# Patient Record
Sex: Male | Born: 1960 | Race: Black or African American | Hispanic: No | Marital: Married | Smoking: Never smoker
Health system: Southern US, Community
[De-identification: ages and names within clinical notes are randomized; demographics above are authoritative.]

## PROBLEM LIST (undated history)

## (undated) DIAGNOSIS — I10 Essential (primary) hypertension: Secondary | ICD-10-CM

## (undated) DIAGNOSIS — T7840XA Allergy, unspecified, initial encounter: Secondary | ICD-10-CM

## (undated) HISTORY — DX: Allergy, unspecified, initial encounter: T78.40XA

## (undated) HISTORY — PX: DG GREAT TOE RIGHT FOOT: HXRAD1657

## (undated) HISTORY — DX: Essential (primary) hypertension: I10

---

## 2009-06-27 HISTORY — PX: KNEE ARTHROSCOPY WITH MENISCAL REPAIR: SHX5653

## 2012-07-23 ENCOUNTER — Ambulatory Visit (INDEPENDENT_AMBULATORY_CARE_PROVIDER_SITE_OTHER): Payer: BC Managed Care – PPO | Admitting: Family Medicine

## 2012-07-23 VITALS — BP 130/84 | HR 56 | Temp 98.1°F | Resp 16 | Ht 68.0 in | Wt 177.0 lb

## 2012-07-23 DIAGNOSIS — Z0000 Encounter for general adult medical examination without abnormal findings: Secondary | ICD-10-CM

## 2012-07-23 LAB — LIPID PANEL
HDL: 55 mg/dL (ref >39)
LDL Cholesterol: 128 mg/dL — ABNORMAL HIGH (ref 0–99)
Total CHOL/HDL Ratio: 3.5 Ratio
Triglycerides: 53 mg/dL (ref <150)

## 2012-07-23 LAB — COMPREHENSIVE METABOLIC PANEL
AST: 14 U/L (ref 0–37)
Albumin: 4.4 g/dL (ref 3.5–5.2)
Alkaline Phosphatase: 45 U/L (ref 39–117)
BUN: 12 mg/dL (ref 6–23)
Creat: 0.97 mg/dL (ref 0.50–1.35)
Glucose, Bld: 103 mg/dL — ABNORMAL HIGH (ref 70–99)
Potassium: 4.1 mEq/L (ref 3.5–5.3)

## 2012-07-23 LAB — POCT CBC
HCT, POC: 46.9 % (ref 43.5–53.7)
Lymph, poc: 2.4 (ref 0.6–3.4)
MCH, POC: 29.6 pg (ref 27–31.2)
MCHC: 32.2 g/dL (ref 31.8–35.4)
MCV: 91.9 fL (ref 80–97)
MID (cbc): 0.4 (ref 0–0.9)
POC LYMPH PERCENT: 40 %L (ref 10–50)
Platelet Count, POC: 301 10*3/uL (ref 142–424)
RDW, POC: 12.6 %
WBC: 6 10*3/uL (ref 4.6–10.2)

## 2012-07-23 LAB — PSA: PSA: 0.49 ng/mL (ref <=4.00)

## 2012-07-23 NOTE — Progress Notes (Addendum)
[facility] [address] [place] [address] [phone]  Date:  07/23/2012   Name:  Isaiah Fowler   DOB:  12/15/1960   MRN:  [id number]  PCP:  No primary provider on file.    Chief Complaint: Employment Physical   History of Present Illness:  Isaiah Fowler is a 52 y.o. 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  He is fasting today, and does not have a PCP.  He is in the [employer] so he does get care at the [place]. He has had some labs done there but is not sure what he had exactly.  He would like to have labs again today. He is UTD on his tdap and flu shots, and had a colonoscopy last year.   He is not aware of any significant medical problems except for allergies for which he sometimes uses benadryl  There is no problem list on file for this patient.   Past Medical History  Diagnosis Date  . Allergy     No past surgical history on file.  History  Substance Use Topics  . Smoking status: Never Smoker   . Smokeless tobacco: Not on file  . Alcohol Use: Not on file    No family history on file.  No Known Allergies  Medication list has been reviewed and updated.  Current Outpatient Prescriptions on File Prior to Visit  Medication Sig Dispense Refill  . diphenhydrAMINE (BENADRYL) 25 mg capsule Take 25 mg by mouth every 6 (six) hours as needed.        Review of Systems:  As per HPI- otherwise negative.   Physical Examination: Filed Vitals:   07/23/12 0816  BP: 130/84  Pulse: 56  Temp: 98.1 F (36.7 C)  Resp: 16   Filed Vitals:   07/23/12 0816  Height: 5\' 8"  (1.727 m)  Weight: 177 lb (80.287 kg)   Body mass index is 26.91 kg/(m^2). Ideal Body Weight: Weight in (lb) to have BMI = 25: 164.1   GEN: WDWN, NAD, Non-toxic, A & O x 3 HEENT: Atraumatic, Normocephalic. Neck supple. No masses, No LAD. Bilateral TM wnl, oropharynx normal.  PEERL,EOMI.   Ears and Nose: No  external deformity. CV: RRR, No M/G/R. No JVD. No thrill. No extra heart sounds. PULM: CTA B, no wheezes, crackles, rhonchi. No retractions. No resp. distress. No accessory muscle use. ABD: S, NT, ND, +BS. No rebound. No HSM. EXTR: No c/c/e NEURO Normal gait. Normal strength and DTR all extremities PSYCH: Normally interactive. Conversant. Not depressed or anxious appearing.  Calm demeanor.  GU: no inguinal hernia, normal rectal exam  Assessment and Plan: 1. Physical exam, annual  POCT CBC, Comprehensive metabolic panel, Lipid panel, PSA   2 year DOT card issued today.   Will plan further follow- up pending labs.  Results for orders placed in visit on 07/23/12  POCT CBC      Component Value Range   WBC 6.0  4.6 - 10.2 K/uL   Lymph, poc 2.4  0.6 - 3.4   POC LYMPH PERCENT 40.0  10 - 50 %L   MID (cbc) 0.4  0 - 0.9   POC MID % 6.2  0 - 12 %M   POC Granulocyte 3.2  2 - 6.9   Granulocyte percent 53.8  37 - 80 %G   RBC 5.10  4.69 - 6.13 M/uL   Hemoglobin  15.1  14.1 - 18.1 g/dL   HCT, POC 16.1  09.6 - 53.7 %   MCV 91.9  80 - 97 fL   MCH, POC 29.6  27 - 31.2 pg   MCHC 32.2  31.8 - 35.4 g/dL   RDW, POC 04.5     Platelet Count, POC 301  142 - 424 K/uL   MPV 8.1  0 - 99.8 fL     [name], MD  11/02/2012-  Did a new card for him today to expire one year after original exam.  This is because he is actually taking lisinopril/ HCTZ which I discovered at his office visit on 10/29/12.  Explained to him that with HTN he qualifies for a one year card only on that date.  He brought in the card issued on 07/23/12 today and I gave a new card which expired on 07/23/2013

## 2012-07-23 NOTE — Patient Instructions (Addendum)
I will be in touch with your labs.  Do not take your benadryl before or during driving as it can cause drowsiness.

## 2012-07-24 ENCOUNTER — Encounter: Payer: Self-pay | Admitting: Family Medicine

## 2012-07-26 ENCOUNTER — Encounter: Payer: Self-pay | Admitting: Family Medicine

## 2012-10-29 ENCOUNTER — Ambulatory Visit (INDEPENDENT_AMBULATORY_CARE_PROVIDER_SITE_OTHER): Payer: BC Managed Care – PPO | Admitting: Family Medicine

## 2012-10-29 VITALS — BP 144/84 | HR 53 | Temp 98.1°F | Resp 16 | Ht 67.0 in | Wt 177.0 lb

## 2012-10-29 DIAGNOSIS — Z2089 Contact with and (suspected) exposure to other communicable diseases: Secondary | ICD-10-CM

## 2012-10-29 DIAGNOSIS — I10 Essential (primary) hypertension: Secondary | ICD-10-CM | POA: Insufficient documentation

## 2012-10-29 DIAGNOSIS — Z202 Contact with and (suspected) exposure to infections with a predominantly sexual mode of transmission: Secondary | ICD-10-CM

## 2012-10-29 MED ORDER — LISINOPRIL-HYDROCHLOROTHIAZIDE 10-12.5 MG PO TABS: 1.0000 | ORAL_TABLET | Freq: Every day | ORAL | Status: DC

## 2012-10-29 NOTE — Patient Instructions (Addendum)
Continue to take your blood pressure medication daily- it will work much better if taken every day.    I must get your old DOT card back by the end of the week- we will exchange it for a one year card because you are being treated for high blood pressure.  If we do not receive your old DOT card we will have to contact the DOT.

## 2012-10-29 NOTE — Progress Notes (Addendum)
[facility] [address] [place] [address] [phone]  Date:  10/29/2012   Name:  Isaiah Fowler   DOB:  04-Aug-1960   MRN:  [id number]  PCP:  No primary provider on file.    Chief Complaint: Medication Refill   History of Present Illness:  Isaiah Fowler is a 52 y.o. very pleasant male patient who presents with the following:  He was here in January of 2014 and had a DOT exam. Of note he did not claim to be on any BP medication at that time. I now noted that his medication list includes lisinopril/ HCTZ. It seems he ?did not understand that this medication was for HTN.  He thought it was "for fluid."    He notes a "knot" in his lower lip for about 1 week.  It does not hurt.  It seems smaller now but he wants me to check this for him.    He is also concerned because a male partner recently contacted him because she has been dx with an STI- he does not know what.  He was last with her about one month ago.  He also used condoms so he is not too worried, but would like to have an STI screen.  He does not have any symptoms such as discharge.   He also needs a RF of his BP medication- HCTZ/ lisinopril. He has been taking this for about 1 year.  He admits that he forgets to take it about 1/2 of the time.    There are no active problems to display for this patient.   Past Medical History  Diagnosis Date  . Allergy     No past surgical history on file.  History  Substance Use Topics  . Smoking status: Never Smoker   . Smokeless tobacco: Not on file  . Alcohol Use: Not on file    No family history on file.  No Known Allergies  Medication list has been reviewed and updated.  Current Outpatient Prescriptions on File Prior to Visit  Medication Sig Dispense Refill  . diphenhydrAMINE (BENADRYL) 25 mg capsule Take 25 mg by mouth every 6 (six) hours as needed.       No current facility-administered medications on file prior to visit.    Review of  Systems:  As per HPI- otherwise negative.   Physical Examination: Filed Vitals:   10/29/12 1041  BP: 144/84  Pulse: 53  Temp: 98.1 F (36.7 C)  Resp: 16   Filed Vitals:   10/29/12 1041  Height: 5\' 7"  (1.702 m)  Weight: 177 lb (80.287 kg)   Body mass index is 27.72 kg/(m^2). Ideal Body Weight: Weight in (lb) to have BMI = 25: 159.3  GEN: WDWN, NAD, Non-toxic, A & O x 3 HEENT: Atraumatic, Normocephalic. Neck supple. No masses, No LAD.  Lower lip- there is a round, firm, mobile mass under the right side of his lower lip. This seems consistent with a cyst, the size of a small pea Ears and Nose: No external deformity. CV: RRR, No M/G/R. No JVD. No thrill. No extra heart sounds. PULM: CTA B, no wheezes, crackles, rhonchi. No retractions. No resp. distress. No accessory muscle use. ABD: S, NT, ND, +BS. No rebound. No HSM. EXTR: No c/c/e NEURO Normal gait.  PSYCH: Normally interactive. Conversant. Not depressed or anxious appearing.  Calm demeanor.  GU: normal, no redness, lesions or discharge   Assessment and Plan: HTN (hypertension) - Plan: lisinopril-hydrochlorothiazide (PRINZIDE,ZESTORETIC)  10-12.5 MG per tablet  Exposure to STD - Plan: Hepatitis B surface antigen, HIV antibody, HSV(herpes simplex vrs) 1+2 ab-IgG, RPR, Hepatitis C antibody, GC/Chlamydia Probe Amp  Refilled his HTN medication.  I did labs for him in January of this year.  Explained that being on HTN medications changes his DOT status- he can only be certified for one year.  He will bring in his old card and will exchange for a new one- he does not have his card with him today.  BP control is ok, but advised him to take the medication every day  Advised him to have his lip cyst looked at by his dentist who may want to refer him to an oral surgeon for removal  Await STI results.    Signed Abbe Amsterdam, MD  10/31/12; called to discuss labs.  LMOM as no answer.  Labs ok, will mail copy. He does need to come in  and bring me his old DOT card so I can change the expiration date.  Asked for this by the end of this week please   Results for orders placed in visit on 10/29/12  GC/CHLAMYDIA PROBE AMP      Result Value Range   CT Probe RNA NEGATIVE     GC Probe RNA NEGATIVE    HEPATITIS B SURFACE ANTIGEN      Result Value Range   Hepatitis B Surface Ag NEGATIVE  NEGATIVE  HIV ANTIBODY (ROUTINE TESTING)      Result Value Range   HIV NON REACTIVE  NON REACTIVE  HSV(HERPES SIMPLEX VRS) I + II AB-IGG      Result Value Range   HSV 1 Glycoprotein G Ab, IgG 11.32 (*)    HSV 2 Glycoprotein G Ab, IgG <0.10    RPR      Result Value Range   RPR NON REAC  NON REAC  HEPATITIS C ANTIBODY      Result Value Range   HCV Ab NEGATIVE  NEGATIVE

## 2012-10-30 LAB — HSV(HERPES SIMPLEX VRS) I + II AB-IGG
HSV 1 Glycoprotein G Ab, IgG: 11.32 IV — ABNORMAL HIGH
HSV 2 Glycoprotein G Ab, IgG: <0.1 IV

## 2012-10-30 LAB — GC/CHLAMYDIA PROBE AMP: GC Probe RNA: NEGATIVE

## 2012-10-31 ENCOUNTER — Encounter: Payer: Self-pay | Admitting: Family Medicine

## 2012-11-02 ENCOUNTER — Telehealth: Payer: Self-pay | Admitting: Family Medicine

## 2012-11-02 NOTE — Telephone Encounter (Signed)
Called Laurelyn Sickle- asked her to please give Mr. Pyper a call and remind him to come in to exchange his DOT card.  She will do so.

## 2014-04-11 ENCOUNTER — Ambulatory Visit (INDEPENDENT_AMBULATORY_CARE_PROVIDER_SITE_OTHER): Payer: BC Managed Care – PPO | Admitting: Family Medicine

## 2014-04-11 ENCOUNTER — Encounter: Payer: Self-pay | Admitting: Family Medicine

## 2014-04-11 ENCOUNTER — Ambulatory Visit (INDEPENDENT_AMBULATORY_CARE_PROVIDER_SITE_OTHER): Payer: Self-pay | Admitting: Family Medicine

## 2014-04-11 ENCOUNTER — Encounter: Payer: BC Managed Care – PPO | Admitting: Family Medicine

## 2014-04-11 VITALS — BP 118/84 | HR 54 | Temp 98.1°F | Resp 16 | Ht 68.0 in | Wt 172.0 lb

## 2014-04-11 DIAGNOSIS — I10 Essential (primary) hypertension: Secondary | ICD-10-CM

## 2014-04-11 DIAGNOSIS — E785 Hyperlipidemia, unspecified: Secondary | ICD-10-CM

## 2014-04-11 DIAGNOSIS — R739 Hyperglycemia, unspecified: Secondary | ICD-10-CM

## 2014-04-11 DIAGNOSIS — Z024 Encounter for examination for driving license: Secondary | ICD-10-CM

## 2014-04-11 DIAGNOSIS — Z23 Encounter for immunization: Secondary | ICD-10-CM

## 2014-04-11 DIAGNOSIS — Z020 Encounter for examination for admission to educational institution: Secondary | ICD-10-CM

## 2014-04-11 LAB — COMPLETE METABOLIC PANEL WITH GFR
ALBUMIN: 4.4 g/dL (ref 3.5–5.2)
ALK PHOS: 43 U/L (ref 39–117)
ALT: 15 U/L (ref 0–53)
AST: 21 U/L (ref 0–37)
BILIRUBIN TOTAL: 1.4 mg/dL — AB (ref 0.2–1.2)
BUN: 12 mg/dL (ref 6–23)
CO2: 30 mEq/L (ref 19–32)
CREATININE: 0.96 mg/dL (ref 0.50–1.35)
Calcium: 9.7 mg/dL (ref 8.4–10.5)
Chloride: 100 mEq/L (ref 96–112)
GFR, EST NON AFRICAN AMERICAN: 89 mL/min
GFR, Est African American: >89 mL/min
GLUCOSE: 96 mg/dL (ref 70–99)
Potassium: 3.9 mEq/L (ref 3.5–5.3)
Sodium: 134 mEq/L — ABNORMAL LOW (ref 135–145)
Total Protein: 7.2 g/dL (ref 6.0–8.3)

## 2014-04-11 LAB — LIPID PANEL
CHOL/HDL RATIO: 3.4 ratio
CHOLESTEROL: 177 mg/dL (ref 0–200)
HDL: 52 mg/dL (ref >39)
LDL Cholesterol: 110 mg/dL — ABNORMAL HIGH (ref 0–99)
TRIGLYCERIDES: 73 mg/dL (ref <150)
VLDL: 15 mg/dL (ref 0–40)

## 2014-04-11 MED ORDER — LISINOPRIL-HYDROCHLOROTHIAZIDE 10-12.5 MG PO TABS: 1.0000 | ORAL_TABLET | Freq: Every day | ORAL | Status: DC

## 2014-04-11 NOTE — Patient Instructions (Signed)
1 year card. See paperwork.

## 2014-04-11 NOTE — Progress Notes (Signed)
Subjective:    Patient ID: Isaiah Fowler, male    DOB: 1961/04/15, 53 y.o.   MRN: [id number]  This chart was scribed for Meredith StaggersJeffrey Anabella Capshaw, MD by Tonye RoyaltyJoshua Chen, ED Scribe. This patient was seen in room 5 and the patient's care was started at 10:12 AM.   Chief Complaint  Patient presents with  . Employment Physical    dot   HPI  HPI Comments: Isaiah Fowler is a 53 y.o. male who [facility] for [facility] physical, see paperwork. He was last issued a 1 year card on 07/23/12 due to hypertension. He denies other medical problems including stroke or heart attack. He states he rarely snores at night, and he denies apnea or daytime somnolence. He denies vision problems except that he uses reading glasses; he denies glare problems at night. He reports normal use of extremities. He denies ever getting lightheaded or dizzy. He states he has 3 jobs and works out often.   Patient Active Problem List   Diagnosis Date Noted  . Essential hypertension, benign 10/29/2012   Past Medical History  Diagnosis Date  . Allergy    History reviewed. No pertinent past surgical history. No Known Allergies Prior to Admission medications   Medication Sig Start Date End Date Taking? Authorizing Provider  diphenhydrAMINE (BENADRYL) 25 mg capsule Take 25 mg by mouth every 6 (six) hours as needed.   Yes Historical Provider, MD  lisinopril-hydrochlorothiazide (PRINZIDE,ZESTORETIC) 10-12.5 MG per tablet Take 1 tablet by mouth daily. 10/29/12  Yes Pearline CablesJessica C Copland, MD   History   Social History  . Marital Status: Married    Spouse Name: N/A    Number of Children: N/A  . Years of Education: N/A   Occupational History  . Not on file.   Social History Main Topics  . Smoking status: Never Smoker   . Smokeless tobacco: Not on file  . Alcohol Use: Not on file  . Drug Use: Not on file  . Sexual Activity: Not on file   Other Topics Concern  . Not on file   Social History Narrative  . No  narrative on file    Review of Systems  Eyes: Negative for visual disturbance.  Respiratory: Negative for apnea.   Neurological: Negative for dizziness, weakness and light-headedness.   Per DOT paperwork, otheriwse negative as above.      Objective:   Physical Exam  Vitals reviewed. Constitutional: He is oriented to person, place, and time. He appears well-developed and well-nourished.  HENT:  Head: Normocephalic and atraumatic.  Right Ear: External ear normal.  Left Ear: External ear normal.  Mouth/Throat: Oropharynx is clear and moist.  Eyes: Conjunctivae and EOM are normal. Pupils are equal, round, and reactive to light.  Neck: Normal range of motion. Neck supple. No thyromegaly present.  Cardiovascular: Normal rate, regular rhythm, normal heart sounds and intact distal pulses.   Pulmonary/Chest: Effort normal and breath sounds normal. No respiratory distress. He has no wheezes.  Abdominal: Soft. He exhibits no distension. There is no tenderness. Hernia confirmed negative in the right inguinal area and confirmed negative in the left inguinal area.  Musculoskeletal: Normal range of motion. He exhibits no edema and no tenderness.  Lymphadenopathy:    He has no cervical adenopathy.  Neurological: He is alert and oriented to person, place, and time. He has normal reflexes.  Skin: Skin is warm and dry.  Psychiatric: He has a normal mood and affect. His behavior is  normal.   Filed Vitals:   04/11/14 0954  BP: 118/84  Pulse: 54  Temp: 98.1 F (36.7 C)  TempSrc: Oral  Resp: 16  Height: 5\' 8"  (1.727 m)  Weight: 172 lb (78.019 kg)  SpO2: 100%    Visual Acuity Screening   Right eye Left eye Both eyes  Without correction: 20/20 20/20 20/13   With correction:     Comments: 85 horizontal field both eye  6/6 color  Hearing Screening Comments: 10 feet right 10 feet Left       Assessment & Plan:  Isaiah Fowler is a 53 y.o. male Encounter for commercial driver medical  examination (CDME)  see ppwk - DOT 1 year card with HTN.   No orders of the defined types were placed in this encounter.   Patient Instructions  1 year card. See paperwork.

## 2014-04-11 NOTE — Progress Notes (Signed)
This encounter was created in error - please disregard.

## 2014-04-11 NOTE — Patient Instructions (Addendum)
I refilled your medication. You should receive a call or letter about your lab results within the next week to 10 days.  We will call you to schedule physical in next 6 months.     Influenza Vaccine (Flu Vaccine, Inactivated or Recombinant) 2014-2015: What You Need to Know 1. Why get vaccinated? Influenza ("flu") is a contagious disease that spreads around the [place] every winter, usually between October and May. Flu is caused by influenza viruses, and is spread mainly by coughing, sneezing, and close contact. Anyone can get flu, but the risk of getting flu is highest among children. Symptoms come on suddenly and may last several days. They can include:  fever/chills  sore throat  muscle aches  fatigue  cough  headache  runny or stuffy nose Flu can make some people much sicker than others. These people include young children, people 265 and older, pregnant women, and people with certain health conditions-such as heart, lung or kidney disease, nervous system disorders, or a weakened immune system. Flu vaccination is especially important for these people, and anyone in close contact with them. Flu can also lead to pneumonia, and make existing medical conditions worse. It can cause diarrhea and seizures in children. Each year thousands of people in the [place] die from flu, and many more are hospitalized. Flu vaccine is the best protection against flu and its complications. Flu vaccine also helps prevent spreading flu from person to person. 2. Inactivated and recombinant flu vaccines You are getting an injectable flu vaccine, which is either an "inactivated" or "recombinant" vaccine. These vaccines do not contain any live influenza virus. They are given by injection with a needle, and often called the "flu shot."  A different live, attenuated (weakened) influenza vaccine is sprayed into the nostrils. This vaccine is described in a separate Vaccine Information Statement. Flu  vaccination is recommended every year. Some children 6 months through 888 years of age might need two doses during one year. Flu viruses are always changing. Each year's flu vaccine is made to protect against 3 or 4 viruses that are likely to cause disease that year. Flu vaccine cannot prevent all cases of flu, but it is the best defense against the disease.  It takes about 2 weeks for protection to develop after the vaccination, and protection lasts several months to a year. Some illnesses that are not caused by influenza virus are often mistaken for flu. Flu vaccine will not prevent these illnesses. It can only prevent influenza. Some inactivated flu vaccine contains a very small amount of a mercury-based preservative called thimerosal. Studies have shown that thimerosal in vaccines is not harmful, but flu vaccines that do not contain a preservative are available. 3. Some people should not get this vaccine Tell the person who gives you the vaccine:  If you have any severe, life-threatening allergies. If you ever had a life-threatening allergic reaction after a dose of flu vaccine, or have a severe allergy to any part of this vaccine, including (for example) an allergy to gelatin, antibiotics, or eggs, you may be advised not to get vaccinated. Most, but not all, types of flu vaccine contain a small amount of egg protein.  If you ever had Guillain-Barr Syndrome (a severe paralyzing illness, also called GBS). Some people with a history of GBS should not get this vaccine. This should be discussed with your doctor.  If you are not feeling well. It is usually okay to get flu vaccine when you have a mild  illness, but you might be advised to wait until you feel better. You should come back when you are better. 4. Risks of a vaccine reaction With a vaccine, like any medicine, there is a chance of side effects. These are usually mild and go away on their own. Problems that could happen after any  vaccine:  Brief fainting spells can happen after any medical procedure, including vaccination. Sitting or lying down for about 15 minutes can help prevent fainting, and injuries caused by a fall. Tell your doctor if you feel dizzy, or have vision changes or ringing in the ears.  Severe shoulder pain and reduced range of motion in the arm where a shot was given can happen, very rarely, after a vaccination.  Severe allergic reactions from a vaccine are very rare, estimated at less than 1 in a million doses. If one were to occur, it would usually be within a few minutes to a few hours after the vaccination. Mild problems following inactivated flu vaccine:  soreness, redness, or swelling where the shot was given  hoarseness  sore, red or itchy eyes  cough  fever  aches  headache  itching  fatigue If these problems occur, they usually begin soon after the shot and last 1 or 2 days. Moderate problems following inactivated flu vaccine:  Young children who get inactivated flu vaccine and pneumococcal vaccine (PCV13) at the same time may be at increased risk for seizures caused by fever. Ask your doctor for more information. Tell your doctor if a child who is getting flu vaccine has ever had a seizure. Inactivated flu vaccine does not contain live flu virus, so you cannot get the flu from this vaccine. As with any medicine, there is a very remote chance of a vaccine causing a serious injury or death. The safety of vaccines is always being monitored. For more information, visit: http://floyd.org/www.cdc.gov/vaccinesafety/ 5. What if there is a serious reaction? What should I look for?  Look for anything that concerns you, such as signs of a severe allergic reaction, very high fever, or behavior changes. Signs of a severe allergic reaction can include hives, swelling of the face and throat, difficulty breathing, a fast heartbeat, dizziness, and weakness. These would start a few minutes to a few hours after  the vaccination. What should I do?  If you think it is a severe allergic reaction or other emergency that can't wait, call 9-1-1 and get the person to the nearest hospital. Otherwise, call your doctor.  Afterward, the reaction should be reported to the Vaccine Adverse Event Reporting System (VAERS). Your doctor should file this report, or you can do it yourself through the VAERS web site at www.vaers.LAgents.nohhs.gov, or by calling 1-445-141-8886. VAERS does not give medical advice. 6. The National Vaccine Injury Compensation Program The [employer] Vaccine Injury Compensation Program (VICP) is a federal program that was created to compensate people who may have been injured by certain vaccines. Persons who believe they may have been injured by a vaccine can learn about the program and about filing a claim by calling 1-229-544-4915 or visiting the VICP website at SpiritualWord.atwww.hrsa.gov/vaccinecompensation. There is a time limit to file a claim for compensation. 7. How can I learn more?  Ask your health care provider.  Call your local or state health department.  Contact the Centers for Disease Control and Prevention (CDC):  Call 61674378691-(724) 876-0393 (1-800-CDC-INFO) or  Visit CDC's website at BiotechRoom.com.cywww.cdc.gov/flu [facility] CenterCDC Vaccine Information Statement (Interim) Inactivated Influenza Vaccine (02/12/2013) Document Released: 04/07/2006 Document  Revised: 10/28/2013 Document Reviewed: 05/31/2013 [facility] Patient Information 2015 [place], [place]. This information is not intended to replace advice given to you by your health care provider. Make sure you discuss any questions you have with your health care provider.

## 2014-04-11 NOTE — Progress Notes (Signed)
Subjective:    Patient ID: Isaiah Fowler, male    DOB: Jul 30, 1960, 53 y.o.   MRN: [id number]  This chart was scribed for Meredith StaggersJeffrey Coleson Kant, MD by Tonye RoyaltyJoshua Chen, ED Scribe. This patient was seen in room 5 and the patient's care was started at 10:26 AM.   Chief Complaint  Patient presents with  . med refills    lisinopril  . Flu Vaccine   HPI  HPI Comments: Isaiah Fowler is a 53 y.o. male with history of hypertension who presents to the [facility] for medication refill. He states he typically comes here for care. He uses Lisinopril 10-12.5 mg BID. He reports taking Benadryl for allergies as needed and states he does not use it while driving. His last blood work was in January 2014 and revealed borderline glucose 103, borderline LDL 128, and normal kidney function with creatinine 0.97. He states he did not fast before that lab work.   Patient Active Problem List   Diagnosis Date Noted  . Essential hypertension, benign 10/29/2012   Past Medical History  Diagnosis Date  . Allergy    No past surgical history on file. No Known Allergies Prior to Admission medications   Medication Sig Start Date End Date Taking? Authorizing Provider  diphenhydrAMINE (BENADRYL) 25 mg capsule Take 25 mg by mouth every 6 (six) hours as needed.    Historical Provider, MD  lisinopril-hydrochlorothiazide (PRINZIDE,ZESTORETIC) 10-12.5 MG per tablet Take 1 tablet by mouth daily. 04/11/14   Shade FloodJeffrey R Inaya Gillham, MD   History   Social History  . Marital Status: Married    Spouse Name: N/A    Number of Children: N/A  . Years of Education: N/A   Occupational History  . Not on file.   Social History Main Topics  . Smoking status: Never Smoker   . Smokeless tobacco: Not on file  . Alcohol Use: Not on file  . Drug Use: Not on file  . Sexual Activity: Not on file   Other Topics Concern  . Not on file   Social History Narrative  . No narrative on file     Review of Systems  Constitutional:  Negative for fatigue and unexpected weight change.  Eyes: Negative for visual disturbance.  Respiratory: Negative for cough, chest tightness and shortness of breath.   Cardiovascular: Negative for chest pain, palpitations and leg swelling.  Gastrointestinal: Negative for abdominal pain and blood in stool.  Neurological: Negative for dizziness, light-headedness and headaches.       Objective:   Physical Exam  Vitals reviewed. Constitutional: He is oriented to person, place, and time. He appears well-developed and well-nourished.  HENT:  Head: Normocephalic and atraumatic.  Eyes: EOM are normal. Pupils are equal, round, and reactive to light.  Neck: No JVD present. Carotid bruit is not present.  Cardiovascular: Normal rate, regular rhythm and normal heart sounds.   No murmur heard. Pulmonary/Chest: Effort normal and breath sounds normal. He has no rales.  Musculoskeletal: He exhibits no edema.  Neurological: He is alert and oriented to person, place, and time.  Skin: Skin is warm and dry.  Psychiatric: He has a normal mood and affect.        Assessment & Plan:   Isaiah Fowler is a 53 y.o. male Essential hypertension - Plan: lisinopril-hydrochlorothiazide (PRINZIDE,ZESTORETIC) 10-12.5 MG per tablet, COMPLETE METABOLIC PANEL WITH GFR, Lipid panel  -controlled, meds refilled x 6 months.  Plan on cpe in next 6 months - will call to  schedule.   Hyperlipidemia - Plan: COMPLETE METABOLIC PANEL WITH GFR, Lipid panel  -borderline LDL when checked last.  fasting this am - repeated lipids and CMP.Marland Kitchen  Hyperglycemia - Plan: COMPLETE METABOLIC PANEL WITH GFR  -borderline on last labs - may not have been fasting but is today. Repeat CMP  Need for prophylactic vaccination and inoculation against influenza - Plan: Flu Vaccine QUAD 36+ mos IM given.   Meds ordered this encounter  Medications  . lisinopril-hydrochlorothiazide (PRINZIDE,ZESTORETIC) 10-12.5 MG per tablet    Sig: Take 1 tablet by  mouth daily.    Dispense:  90 tablet    Refill:  1   Patient Instructions  I refilled your medication. You should receive a call or letter about your lab results within the next week to 10 days.  We will call you to schedule physical in next 6 months.     Influenza Vaccine (Flu Vaccine, Inactivated or Recombinant) 2014-2015: What You Need to Know 1. Why get vaccinated? Influenza ("flu") is a contagious disease that spreads around the [place] every winter, usually between October and May. Flu is caused by influenza viruses, and is spread mainly by coughing, sneezing, and close contact. Anyone can get flu, but the risk of getting flu is highest among children. Symptoms come on suddenly and may last several days. They can include:  fever/chills  sore throat  muscle aches  fatigue  cough  headache  runny or stuffy nose Flu can make some people much sicker than others. These people include young children, people 89 and older, pregnant women, and people with certain health conditions-such as heart, lung or kidney disease, nervous system disorders, or a weakened immune system. Flu vaccination is especially important for these people, and anyone in close contact with them. Flu can also lead to pneumonia, and make existing medical conditions worse. It can cause diarrhea and seizures in children. Each year thousands of people in the [place] die from flu, and many more are hospitalized. Flu vaccine is the best protection against flu and its complications. Flu vaccine also helps prevent spreading flu from person to person. 2. Inactivated and recombinant flu vaccines You are getting an injectable flu vaccine, which is either an "inactivated" or "recombinant" vaccine. These vaccines do not contain any live influenza virus. They are given by injection with a needle, and often called the "flu shot."  A different live, attenuated (weakened) influenza vaccine is sprayed into the  nostrils. This vaccine is described in a separate Vaccine Information Statement. Flu vaccination is recommended every year. Some children 6 months through 35 years of age might need two doses during one year. Flu viruses are always changing. Each year's flu vaccine is made to protect against 3 or 4 viruses that are likely to cause disease that year. Flu vaccine cannot prevent all cases of flu, but it is the best defense against the disease.  It takes about 2 weeks for protection to develop after the vaccination, and protection lasts several months to a year. Some illnesses that are not caused by influenza virus are often mistaken for flu. Flu vaccine will not prevent these illnesses. It can only prevent influenza. Some inactivated flu vaccine contains a very small amount of a mercury-based preservative called thimerosal. Studies have shown that thimerosal in vaccines is not harmful, but flu vaccines that do not contain a preservative are available. 3. Some people should not get this vaccine Tell the person who gives you the vaccine:  If you  have any severe, life-threatening allergies. If you ever had a life-threatening allergic reaction after a dose of flu vaccine, or have a severe allergy to any part of this vaccine, including (for example) an allergy to gelatin, antibiotics, or eggs, you may be advised not to get vaccinated. Most, but not all, types of flu vaccine contain a small amount of egg protein.  If you ever had Guillain-Barr Syndrome (a severe paralyzing illness, also called GBS). Some people with a history of GBS should not get this vaccine. This should be discussed with your doctor.  If you are not feeling well. It is usually okay to get flu vaccine when you have a mild illness, but you might be advised to wait until you feel better. You should come back when you are better. 4. Risks of a vaccine reaction With a vaccine, like any medicine, there is a chance of side effects. These are  usually mild and go away on their own. Problems that could happen after any vaccine:  Brief fainting spells can happen after any medical procedure, including vaccination. Sitting or lying down for about 15 minutes can help prevent fainting, and injuries caused by a fall. Tell your doctor if you feel dizzy, or have vision changes or ringing in the ears.  Severe shoulder pain and reduced range of motion in the arm where a shot was given can happen, very rarely, after a vaccination.  Severe allergic reactions from a vaccine are very rare, estimated at less than 1 in a million doses. If one were to occur, it would usually be within a few minutes to a few hours after the vaccination. Mild problems following inactivated flu vaccine:  soreness, redness, or swelling where the shot was given  hoarseness  sore, red or itchy eyes  cough  fever  aches  headache  itching  fatigue If these problems occur, they usually begin soon after the shot and last 1 or 2 days. Moderate problems following inactivated flu vaccine:  Young children who get inactivated flu vaccine and pneumococcal vaccine (PCV13) at the same time may be at increased risk for seizures caused by fever. Ask your doctor for more information. Tell your doctor if a child who is getting flu vaccine has ever had a seizure. Inactivated flu vaccine does not contain live flu virus, so you cannot get the flu from this vaccine. As with any medicine, there is a very remote chance of a vaccine causing a serious injury or death. The safety of vaccines is always being monitored. For more information, visit: http://floyd.org/www.cdc.gov/vaccinesafety/ 5. What if there is a serious reaction? What should I look for?  Look for anything that concerns you, such as signs of a severe allergic reaction, very high fever, or behavior changes. Signs of a severe allergic reaction can include hives, swelling of the face and throat, difficulty breathing, a fast heartbeat,  dizziness, and weakness. These would start a few minutes to a few hours after the vaccination. What should I do?  If you think it is a severe allergic reaction or other emergency that can't wait, call 9-1-1 and get the person to the nearest hospital. Otherwise, call your doctor.  Afterward, the reaction should be reported to the Vaccine Adverse Event Reporting System (VAERS). Your doctor should file this report, or you can do it yourself through the VAERS web site at www.vaers.LAgents.nohhs.gov, or by calling 1-608-263-8731. VAERS does not give medical advice. 6. The National Vaccine Injury Compensation Program The [employer] Vaccine Injury Compensation  Program (VICP) is a Stage manager that was created to compensate people who may have been injured by certain vaccines. Persons who believe they may have been injured by a vaccine can learn about the program and about filing a claim by calling 1-(984) 534-3894 or visiting the VICP website at SpiritualWord.at. There is a time limit to file a claim for compensation. 7. How can I learn more?  Ask your health care provider.  Call your local or state health department.  Contact the Centers for Disease Control and Prevention (CDC):  Call (904)878-5537 (1-800-CDC-INFO) or  Visit CDC's website at BiotechRoom.com.cy CDC Vaccine Information Statement (Interim) Inactivated Influenza Vaccine (02/12/2013) Document Released: 04/07/2006 Document Revised: 10/28/2013 Document Reviewed: 05/31/2013 [facility] Patient Information 2015 [place], [place]. This information is not intended to replace advice given to you by your health care provider. Make sure you discuss any questions you have with your health care provider.     I personally performed the services described in this documentation, which was scribed in my presence. The recorded information has been reviewed and considered, and addended by me as needed.

## 2014-04-17 ENCOUNTER — Other Ambulatory Visit: Payer: Self-pay | Admitting: Family Medicine

## 2014-04-17 DIAGNOSIS — E871 Hypo-osmolality and hyponatremia: Secondary | ICD-10-CM

## 2014-04-17 DIAGNOSIS — R17 Unspecified jaundice: Secondary | ICD-10-CM

## 2014-04-18 NOTE — Progress Notes (Signed)
Left message with patient's wife for him to CB to schedule a physical appointment.

## 2014-04-28 NOTE — Progress Notes (Signed)
Left message for patient to CB to schedule appointment for cpe in 6 months.

## 2014-05-02 ENCOUNTER — Telehealth: Payer: Self-pay | Admitting: Family Medicine

## 2014-05-02 NOTE — Telephone Encounter (Signed)
LMOM to CB. 

## 2014-05-02 NOTE — Telephone Encounter (Signed)
Patient returning a call to our office in regards to him scheduling an appointment for a CPE. Per patient he already sees a [facility] Provider in [place] and will schedule a CPE with them. Patients call back number is [phone]

## 2014-05-02 NOTE — Telephone Encounter (Signed)
-----   Message from Shade FloodJeffrey R Greene, MD sent at 04/11/2014 10:46 AM EDT ----- cpe with any provider accepting new patients. Next 6 months.

## 2015-01-16 ENCOUNTER — Ambulatory Visit (INDEPENDENT_AMBULATORY_CARE_PROVIDER_SITE_OTHER): Payer: 59 | Admitting: Family Medicine

## 2015-01-16 ENCOUNTER — Ambulatory Visit (INDEPENDENT_AMBULATORY_CARE_PROVIDER_SITE_OTHER): Payer: 59

## 2015-01-16 VITALS — BP 150/94 | HR 76 | Temp 98.6°F | Resp 16 | Ht 69.0 in | Wt 177.0 lb

## 2015-01-16 DIAGNOSIS — M79672 Pain in left foot: Secondary | ICD-10-CM

## 2015-01-16 DIAGNOSIS — S9032XA Contusion of left foot, initial encounter: Secondary | ICD-10-CM

## 2015-01-16 MED ORDER — HYDROCODONE-ACETAMINOPHEN 5-325 MG PO TABS: ORAL_TABLET | ORAL | Status: DC

## 2015-01-16 NOTE — Progress Notes (Signed)
  Subjective:  Patient ID: Isaiah Fowler, male    DOB: Jun 03, 1961  Age: 55 y.o. MRN: [id number]  54 year old man who dropped a tool box on foot. He was cleaning out his truck, not on the job. It was fairly heavy. He had his boots on, not steel toed, and it run across the dorsum of the foot.  Other than hypertension which is on medication for, he is healthy. Is not diabetic.  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 Objective:   Pleasant gentleman, painful foot. Obvious erythema across top of the foot. He's very tender to touch from the first to the fifth toe, mostly to the fourth toe metatarsal areas. He has an old blister partially healed on the dorsum of his third toe. He is tender under the arch also.  Told him about the high blood pressure today, he checks it every day at home and it is usually good. I think he is just elevated from the pain today.  Assessment & Plan:   Assessment: Foot pain secondary to contusion of foot, an initial encounter  Plan: [facility] reading (PRIMARY) by  Dr. Alwyn Ren Probably normal.  Some question about proximal 3rd metatarsal has a suspicious line across it which does not break the cortex.  Radiologist did not see a fracture. Patient is called about the results. He was fitted with a postop shoe.  . Patient Instructions  Apply ice for 15 or 20 minutes 4-6 times a day for the next couple of days.  Stay off foot as possible, keeping it elevated.  Wear firm soled shoe  Return for recheck if not improving over the next week or two.   I did not think it is broken, but will wait and see how radiologist reads the films also.  No working this weekend  Take hydrocodone/APAP 5/325 one every 4-6 hours as needed for moderately severe pain  Take ibuprofen 800 mg maximum of 3 daily for milder pain     Macie Baum, MD 01/16/2015

## 2015-01-16 NOTE — Patient Instructions (Addendum)
Apply ice for 15 or 20 minutes 4-6 times a day for the next couple of days.  Stay off foot as possible, keeping it elevated.  Wear firm soled shoe  Return for recheck if not improving over the next week or two.   I did not think it is broken, but will wait and see how radiologist reads the films also.  No working this weekend  Take hydrocodone/APAP 5/325 one every 4-6 hours as needed for moderately severe pain  Take ibuprofen 800 mg maximum of 3 daily for milder pain

## 2015-01-26 ENCOUNTER — Ambulatory Visit (INDEPENDENT_AMBULATORY_CARE_PROVIDER_SITE_OTHER): Payer: 59 | Admitting: Emergency Medicine

## 2015-01-26 ENCOUNTER — Ambulatory Visit (INDEPENDENT_AMBULATORY_CARE_PROVIDER_SITE_OTHER): Payer: 59

## 2015-01-26 VITALS — BP 122/72 | HR 64 | Temp 98.4°F | Resp 17 | Ht 68.0 in | Wt 175.0 lb

## 2015-01-26 DIAGNOSIS — S9032XD Contusion of left foot, subsequent encounter: Secondary | ICD-10-CM | POA: Diagnosis not present

## 2015-01-26 DIAGNOSIS — M79672 Pain in left foot: Secondary | ICD-10-CM

## 2015-01-26 MED ORDER — HYDROCODONE-ACETAMINOPHEN 5-325 MG PO TABS: ORAL_TABLET | ORAL | Status: DC

## 2015-01-26 MED ORDER — MELOXICAM 15 MG PO TABS
15.0000 mg | ORAL_TABLET | Freq: Every day | ORAL | Status: DC
Start: 2015-01-26 — End: 2017-03-07

## 2015-01-26 NOTE — Patient Instructions (Signed)
Continue light duty and use of the post-op shoe. Elevate the foot whenever you can. Avoid walking or standing for periods longer than 5-10 minutes whenever possible.

## 2015-01-26 NOTE — Progress Notes (Signed)
Patient ID: Isaiah Fowler, male    DOB: 06/03/1961, 54 y.o.   MRN: [id number]  PCP: No PCP Per Patient  Subjective:   Chief Complaint  Patient presents with  . Follow-up    foot injury left side     HPI Presents for evaluation of Contusion of the LEFT foot.  Seen initially for this injury on 01/16/2015, after his tool box fell on the foot. Xrays were NEGATIVE for fracture. He was prescribed hydrocodone for pain and placed in a post-op shoe.  No improvement. Still painful and swollen. Unable to resume wearing regular shoes due to increased pain. Standing for more than 5-10 minutes pain increases. Pain remains at rest, without any weight bearing. Hydrocodone seems to help some-he had to take it regularly this past weekend, while shopping for back to school supplies for his sons.  Missed his annual drill with the [employer]. He cannot return even to monthly drills until he is released to full participation. 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    Review of Systems As above. No fever/chills. No GI symptoms.    Patient Active Problem List   Diagnosis Date Noted  . Essential hypertension, benign 10/29/2012     Prior to Admission medications   Medication Sig Start Date End Date Taking? Authorizing Provider  diphenhydrAMINE (BENADRYL) 25 mg capsule Take 25 mg by mouth every 6 (six) hours as needed.   Yes Historical Provider, MD  HYDROcodone-acetaminophen (NORCO) 5-325 MG per tablet Take one every 4-6 hours as needed for moderately severe pain 01/16/15  Yes Peyton Najjar, MD  lisinopril-hydrochlorothiazide (PRINZIDE,ZESTORETIC) 10-12.5 MG per tablet Take 1 tablet by mouth daily. 04/11/14  Yes Shade Flood, MD     No Known Allergies     Objective:  Physical Exam  Constitutional: He is oriented to person, place, and time. He appears well-developed and well-nourished. He is active and cooperative. No distress.  BP 122/72 mmHg  Pulse 64  Temp(Src) 98.4 F  (36.9 C) (Oral)  Resp 17  Ht 5\' 8"  (1.727 m)  Wt 175 lb (79.379 kg)  BMI 26.61 kg/m2  SpO2 98%   Eyes: Conjunctivae are normal.  Pulmonary/Chest: Effort normal.  Musculoskeletal:       Left ankle: Normal. Achilles tendon normal.       Left foot: There is tenderness, bony tenderness and swelling. There is normal range of motion, normal capillary refill, no crepitus, no deformity and no laceration.  Neurological: He is alert and oriented to person, place, and time.  Skin: Skin is warm and dry. No erythema.  Psychiatric: He has a normal mood and affect. His speech is normal and behavior is normal.   LEFT FOOT: [facility] reading (PRIMARY) by  Dr. Cleta Alberts. 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 When compared to films from 01/16/2015 question fx of the medial aspect of the proximal aspect of the proximal phalanx.          Assessment & Plan:   1. Foot pain, left 2. Foot contusion, left, subsequent encounter Continue PRN hydrocodone. Add Meloxicam. Continue with light duty and post-op shoe. Suspect that healing will take longer than usual due to the extensive DJD of the great toe and severity of contusion resulting in lots of bleeding into the foot. - DG Foot Complete Left; Future - meloxicam (MOBIC) 15 MG tablet; Take 1 tablet (15 mg total) by mouth daily.  Dispense: 30 tablet; Refill: 0 - HYDROcodone-acetaminophen (NORCO) 5-325  MG per tablet; Take one every 4-6 hours as needed for moderately severe pain  Dispense: 30 tablet; Refill: 0  This case was discussed with Dr. Cleta Alberts, who also examined the patient and developed the treatment plan.  Fernande Bras, PA-C Physician Assistant-Certified [facility]

## 2016-01-08 DIAGNOSIS — N529 Male erectile dysfunction, unspecified: Secondary | ICD-10-CM | POA: Insufficient documentation

## 2016-01-08 DIAGNOSIS — G4733 Obstructive sleep apnea (adult) (pediatric): Secondary | ICD-10-CM | POA: Insufficient documentation

## 2017-03-07 ENCOUNTER — Ambulatory Visit (INDEPENDENT_AMBULATORY_CARE_PROVIDER_SITE_OTHER): Payer: No Typology Code available for payment source

## 2017-03-07 ENCOUNTER — Ambulatory Visit (INDEPENDENT_AMBULATORY_CARE_PROVIDER_SITE_OTHER): Payer: No Typology Code available for payment source | Admitting: Emergency Medicine

## 2017-03-07 ENCOUNTER — Encounter: Payer: Self-pay | Admitting: Emergency Medicine

## 2017-03-07 VITALS — BP 149/83 | HR 74 | Temp 98.0°F | Resp 18 | Ht 68.0 in | Wt 174.0 lb

## 2017-03-07 DIAGNOSIS — S43401A Unspecified sprain of right shoulder joint, initial encounter: Secondary | ICD-10-CM

## 2017-03-07 DIAGNOSIS — M25511 Pain in right shoulder: Secondary | ICD-10-CM

## 2017-03-07 DIAGNOSIS — M79671 Pain in right foot: Secondary | ICD-10-CM | POA: Diagnosis not present

## 2017-03-07 DIAGNOSIS — S93401A Sprain of unspecified ligament of right ankle, initial encounter: Secondary | ICD-10-CM | POA: Diagnosis not present

## 2017-03-07 DIAGNOSIS — M25571 Pain in right ankle and joints of right foot: Secondary | ICD-10-CM

## 2017-03-07 MED ORDER — DICLOFENAC SODIUM 75 MG PO TBEC
75.0000 mg | DELAYED_RELEASE_TABLET | Freq: Two times a day (BID) | ORAL | 0 refills | Status: AC
Start: 2017-03-07 — End: 2017-03-12

## 2017-03-07 NOTE — Patient Instructions (Addendum)
IF you received an x-ray today, you will receive an invoice from [facility]. Please contact [facility] at [phone] with questions or concerns regarding your invoice.   IF you received labwork today, you will receive an invoice from [place]. Please contact [facility] at [phone] with questions or concerns regarding your invoice.   Our billing staff will not be able to assist you with questions regarding bills from these companies.  You will be contacted with the lab results as soon as they are available. The fastest way to get your results is to activate your My Chart account. Instructions are located on the last page of this paperwork. If you have not heard from [place] regarding the results in 2 weeks, please contact this office.      Ankle Sprain An ankle sprain is a stretch or tear in one of the tough tissues (ligaments) in your ankle. Follow these instructions at home:  Rest your ankle.  Take over-the-counter and prescription medicines only as told by your doctor.  For 2-3 days, keep your ankle higher than the level of your heart (elevated) as much as possible.  If directed, put ice on the area: ? Put ice in a plastic bag. ? Place a towel between your skin and the bag. ? Leave the ice on for 20 minutes, 2-3 times a day.  If you were given a brace: ? Wear it as told. ? Take it off to shower or bathe. ? Try not to move your ankle much, but wiggle your toes from time to time. This helps to prevent swelling.  If you were given an elastic bandage (dressing): ? Take it off when you shower or bathe. ? Try not to move your ankle much, but wiggle your toes from time to time. This helps to prevent swelling. ? Adjust the bandage to make it more comfortable if it feels too tight. ? Loosen the bandage if you lose feeling in your foot, your foot tingles, or your foot gets cold and blue.  If you have crutches, use them as told by your doctor. Continue to use  them until you can walk without feeling pain in your ankle. Contact a doctor if:  Your bruises or swelling are quickly getting worse.  Your pain does not get better after you take medicine. Get help right away if:  You cannot feel your toes or foot.  Your toes or your foot looks blue.  You have very bad pain that gets worse. This information is not intended to replace advice given to you by your health care provider. Make sure you discuss any questions you have with your health care provider. Document Released: 11/30/2007 Document Revised: 11/19/2015 Document Reviewed: 01/13/2015 Elsevier Interactive Patient Education  2018 [employer].  Shoulder Sprain A shoulder sprain is a partial or complete tear in one of the tough, fiber-like tissues (ligaments) in the shoulder. The ligaments in the shoulder help to hold the shoulder in place. What are the causes? This condition may be caused by:  A fall.  A hit to the shoulder.  A twist of the arm.  What increases the risk? This condition is more likely to develop in:  People who play sports.  People who have problems with balance or coordination.  What are the signs or symptoms? Symptoms of this condition include:  Pain when moving the shoulder.  Limited ability to move the shoulder.  Swelling and tenderness on top of the shoulder.  Warmth in the  shoulder.  A change in the shape of the shoulder.  Redness or bruising on the shoulder.  How is this diagnosed? This condition is diagnosed with a physical exam. During the exam, you may be asked to do simple exercises with your shoulder. You may also have imaging tests, such as X-rays, MRI, or a CT scan. These tests can show how severe the sprain is. How is this treated? This condition may be treated with:  Rest.  Pain medicine.  Ice.  A sling or brace. This is used to keep the arm still while the shoulder is healing.  Physical therapy or rehabilitation exercises.  These help to improve the range of motion and strength of the shoulder.  Surgery (rare). Surgery may be needed if the sprain caused a joint to become unstable. Surgery may also be needed to reduce pain.  Some people may develop ongoing shoulder pain or lose some range of motion in the shoulder. However, most people do not develop long-term problems. Follow these instructions at home:  Rest.  Ask your health care provider when it is safe for you to drive if you have a sling or brace on your shoulder.  Take over-the-counter and prescription medicines only as told by your health care provider.  If directed, apply ice to the area: ? Put ice in a plastic bag. ? Place a towel between your skin and the bag. ? Leave the ice on for 20 minutes, 2-3 times per day.  If you were given a shoulder sling or brace: ? Wear it as told. ? Remove it to shower or bathe. ? Move your arm only as much as told by your health care provider, but keep your hand moving to prevent swelling.  If you were shown how to do any exercises, do them as told by your health care provider.  Keep all follow-up visits as told by your health care provider. This is important. Contact a health care provider if:  Your pain gets worse.  Your pain is not relieved with medicines.  You have increased redness or swelling. Get help right away if:  You have a fever.  You cannot move your arm or shoulder.  You develop severe numbness or tingling in your arm, hand, or fingers.  Your arm, hand, or fingers turn blue, white, or gray and feel cold. This information is not intended to replace advice given to you by your health care provider. Make sure you discuss any questions you have with your health care provider. Document Released: 10/30/2008 Document Revised: 02/07/2016 Document Reviewed: 10/06/2014 Elsevier Interactive Patient Education  2017 [employer]

## 2017-03-07 NOTE — Progress Notes (Signed)
Isaiah Fowler 56 y.o.   Chief Complaint  Patient presents with  . Shoulder Pain    right shoulder hurts  . 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 male complaining of pain to right shoulder, ankle, and foot since fall 10 days ago; sharp pains worse with movement.  HPI   Prior to Admission medications   Medication Sig Start Date End Date Taking? Authorizing Provider  IBUPROFEN PO Take by mouth.   Yes [provider]  lisinopril-hydrochlorothiazide (PRINZIDE,ZESTORETIC) 10-12.5 MG per tablet Take 1 tablet by mouth daily. 04/11/14  Yes Isaiah Fowler, Isaiah R, MD    No Known Allergies  Patient Active Problem List   Diagnosis Date Noted  . Essential hypertension, benign 10/29/2012    Past Medical History:  Diagnosis Date  . Allergy   . Hypertension     Past Surgical History:  Procedure Laterality Date  . KNEE ARTHROSCOPY WITH MENISCAL REPAIR Left 2011   during [place] deployment    Social History   Social History  . Marital status: Married    Spouse name: Isaiah BureauMarica  . Number of children: 4  . Years of education: 12th grade   Occupational History  . [employer]    Social History Main Topics  . Smoking status: Never Smoker  . Smokeless tobacco: Never Used  . Alcohol use 1.2 oz/week    2 Standard drinks or equivalent per week     Comment: socially; once a week  . Drug use: No  . Sexual activity: Not on file   Other Topics Concern  . Not on file   Social History Narrative   Lives with his wife and their 4 sons.    No family history on file.   Review of Systems  Constitutional: Negative.  Negative for chills and fever.  Respiratory: Negative for shortness of breath.   Gastrointestinal: Negative for nausea and vomiting.  Musculoskeletal: Positive for joint pain.  Skin: Negative.  Negative for rash.  Neurological: Negative for dizziness, sensory change, focal weakness and headaches.    Endo/Heme/Allergies: Negative.   All other systems reviewed and are negative.  Vitals:   03/07/17 1451  BP: (!) 149/83  Pulse: 74  Resp: 18  Temp: 98 F (36.7 C)  SpO2: 100%     Physical Exam  Constitutional: He is oriented to person, place, and time. He appears well-developed and well-nourished.  HENT:  Head: Normocephalic and atraumatic.  Eyes: Pupils are equal, round, and reactive to light. EOM are normal.  Cardiovascular: Normal rate.   Pulmonary/Chest: Effort normal.  Musculoskeletal:  Right shoulder: LROM due to pain.  Right ankle: +lateral swelling and tenderness; no bruising. FROM Right foot: NVI with FROM; mild heel tenderness.  Neurological: He is alert and oriented to person, place, and time.  Skin: Skin is warm and dry. Capillary refill takes less than 2 seconds. No rash noted.  Psychiatric: He has a normal mood and affect. His behavior is normal.  Vitals reviewed.  Dg Shoulder Right  Result Date: 03/07/2017 CLINICAL DATA:  Acute right shoulder pain. EXAM: RIGHT SHOULDER - 2+ VIEW COMPARISON:  None. FINDINGS: There is no evidence of acute fracture or dislocation. There is mild spurring along the margins of the glenoid. Glenohumeral joint space width is maintained. There is minimal acromioclavicular spurring. No soft tissue abnormality is seen. IMPRESSION: Minimal degenerative changes without acute osseous abnormality. Electronically Signed  By: Isaiah Fowler M.D.   On: 03/07/2017 15:25   Dg Ankle Complete Right  Result Date: 03/07/2017 CLINICAL DATA:  Acute right foot and ankle pain. EXAM: RIGHT ANKLE - COMPLETE 3+ VIEW; RIGHT FOOT COMPLETE - 3+ VIEW COMPARISON:  None. FINDINGS: There is no evidence of fracture, dislocation, or joint effusion. Tiny well corticated ossific density adjacent to the lateral malleolus is likely due to prior trauma. The talar dome is intact. The ankle mortise is symmetric. 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 Os  peroneum. Mild soft tissue swelling over lateral malleolus. IMPRESSION: 1. No acute osseous abnormality. Mild soft tissue swelling over the lateral malleolus. 2. 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 Electronically Signed   By: Isaiah Fowler M.D.   On: 03/07/2017 15:24   Dg Foot Complete Right  Result Date: 03/07/2017 CLINICAL DATA:  Acute right foot and ankle pain. EXAM: RIGHT ANKLE - COMPLETE 3+ VIEW; RIGHT FOOT COMPLETE - 3+ VIEW COMPARISON:  None. FINDINGS: There is no evidence of fracture, dislocation, or joint effusion. Tiny well corticated ossific density adjacent to the lateral malleolus is likely due to prior trauma. The talar dome is intact. The ankle mortise is symmetric. 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 Os peroneum. Mild soft tissue swelling over lateral malleolus. IMPRESSION: 1. No acute osseous abnormality. Mild soft tissue swelling over the lateral malleolus. 2. 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 Electronically Signed   By: Isaiah Fowler M.D.   On: 03/07/2017 15:24    ASSESSMENT & PLAN: Isaiah Fowler was seen today for shoulder pain and ankle pain.  Diagnoses and all orders for this visit:  Acute pain of right shoulder -     DG Shoulder Right; Future  Acute right ankle pain -     DG Ankle Complete Right; Future  Right foot pain -     DG Foot Complete Right; Future  Sprain of right shoulder, unspecified shoulder sprain type, initial encounter  Sprain of right ankle, unspecified ligament, initial encounter  Other orders -     diclofenac (VOLTAREN) 75 MG EC tablet; Take 1 tablet (75 mg total) by mouth 2 (two) times daily.    Patient Instructions       IF you received an x-ray today, you will receive an invoice from [facility]. Please contact [facility] at [phone] with questions or concerns regarding your invoice.   IF you received labwork today, you will receive  an invoice from [place]. Please contact [facility] at [phone] with questions or concerns regarding your invoice.   Our billing staff will not be able to assist you with questions regarding bills from these companies.  You will be contacted with the lab results as soon as they are available. The fastest way to get your results is to activate your My Chart account. Instructions are located on the last page of this paperwork. If you have not heard from [place] regarding the results in 2 weeks, please contact this office.      Ankle Sprain An ankle sprain is a stretch or tear in one of the tough tissues (ligaments) in your ankle. Follow these instructions at home:  Rest your ankle.  Take over-the-counter and prescription medicines only as told by your doctor.  For 2-3 days, keep your ankle higher than the level of your heart (elevated) as much as possible.  If directed, put ice on the area: ? Put  ice in a plastic bag. ? Place a towel between your skin and the bag. ? Leave the ice on for 20 minutes, 2-3 times a day.  If you were given a brace: ? Wear it as told. ? Take it off to shower or bathe. ? Try not to move your ankle much, but wiggle your toes from time to time. This helps to prevent swelling.  If you were given an elastic bandage (dressing): ? Take it off when you shower or bathe. ? Try not to move your ankle much, but wiggle your toes from time to time. This helps to prevent swelling. ? Adjust the bandage to make it more comfortable if it feels too tight. ? Loosen the bandage if you lose feeling in your foot, your foot tingles, or your foot gets cold and blue.  If you have crutches, use them as told by your doctor. Continue to use them until you can walk without feeling pain in your ankle. Contact a doctor if:  Your bruises or swelling are quickly getting worse.  Your pain does not get better after you take medicine. Get help right away if:  You cannot feel your toes or  foot.  Your toes or your foot looks blue.  You have very bad pain that gets worse. This information is not intended to replace advice given to you by your health care provider. Make sure you discuss any questions you have with your health care provider. Document Released: 11/30/2007 Document Revised: 11/19/2015 Document Reviewed: 01/13/2015 Elsevier Interactive Patient Education  2018 [employer].  Shoulder Sprain A shoulder sprain is a partial or complete tear in one of the tough, fiber-like tissues (ligaments) in the shoulder. The ligaments in the shoulder help to hold the shoulder in place. What are the causes? This condition may be caused by:  A fall.  A hit to the shoulder.  A twist of the arm.  What increases the risk? This condition is more likely to develop in:  People who play sports.  People who have problems with balance or coordination.  What are the signs or symptoms? Symptoms of this condition include:  Pain when moving the shoulder.  Limited ability to move the shoulder.  Swelling and tenderness on top of the shoulder.  Warmth in the shoulder.  A change in the shape of the shoulder.  Redness or bruising on the shoulder.  How is this diagnosed? This condition is diagnosed with a physical exam. During the exam, you may be asked to do simple exercises with your shoulder. You may also have imaging tests, such as X-rays, MRI, or a CT scan. These tests can show how severe the sprain is. How is this treated? This condition may be treated with:  Rest.  Pain medicine.  Ice.  A sling or brace. This is used to keep the arm still while the shoulder is healing.  Physical therapy or rehabilitation exercises. These help to improve the range of motion and strength of the shoulder.  Surgery (rare). Surgery may be needed if the sprain caused a joint to become unstable. Surgery may also be needed to reduce pain.  Some people may develop ongoing shoulder pain  or lose some range of motion in the shoulder. However, most people do not develop long-term problems. Follow these instructions at home:  Rest.  Ask your health care provider when it is safe for you to drive if you have a sling or brace on your shoulder.  Take over-the-counter and  prescription medicines only as told by your health care provider.  If directed, apply ice to the area: ? Put ice in a plastic bag. ? Place a towel between your skin and the bag. ? Leave the ice on for 20 minutes, 2-3 times per day.  If you were given a shoulder sling or brace: ? Wear it as told. ? Remove it to shower or bathe. ? Move your arm only as much as told by your health care provider, but keep your hand moving to prevent swelling.  If you were shown how to do any exercises, do them as told by your health care provider.  Keep all follow-up visits as told by your health care provider. This is important. Contact a health care provider if:  Your pain gets worse.  Your pain is not relieved with medicines.  You have increased redness or swelling. Get help right away if:  You have a fever.  You cannot move your arm or shoulder.  You develop severe numbness or tingling in your arm, hand, or fingers.  Your arm, hand, or fingers turn blue, white, or gray and feel cold. This information is not intended to replace advice given to you by your health care provider. Make sure you discuss any questions you have with your health care provider. Document Released: 10/30/2008 Document Revised: 02/07/2016 Document Reviewed: 10/06/2014 Elsevier Interactive Patient Education  2017 [employer] Inc.      Edwina Barth, MD [facility]

## 2017-04-12 ENCOUNTER — Encounter: Payer: Self-pay | Admitting: Emergency Medicine

## 2017-04-12 ENCOUNTER — Ambulatory Visit (INDEPENDENT_AMBULATORY_CARE_PROVIDER_SITE_OTHER): Payer: No Typology Code available for payment source | Admitting: Emergency Medicine

## 2017-04-12 VITALS — BP 120/72 | HR 86 | Temp 98.6°F | Resp 16 | Ht 67.5 in | Wt 171.4 lb

## 2017-04-12 DIAGNOSIS — M25571 Pain in right ankle and joints of right foot: Secondary | ICD-10-CM

## 2017-04-12 DIAGNOSIS — S93401D Sprain of unspecified ligament of right ankle, subsequent encounter: Secondary | ICD-10-CM | POA: Diagnosis not present

## 2017-04-12 DIAGNOSIS — Z23 Encounter for immunization: Secondary | ICD-10-CM | POA: Diagnosis not present

## 2017-04-12 DIAGNOSIS — M25511 Pain in right shoulder: Secondary | ICD-10-CM | POA: Diagnosis not present

## 2017-04-12 MED ORDER — IBUPROFEN 600 MG PO TABS: 600.0000 mg | ORAL_TABLET | Freq: Three times a day (TID) | ORAL | 0 refills | Status: DC | PRN

## 2017-04-12 NOTE — Patient Instructions (Addendum)
     IF you received an x-ray today, you will receive an invoice from [place] [facility]. Please contact [place] [facility] at [phone] with questions or concerns regarding your invoice.   IF you received labwork today, you will receive an invoice from [facility]. Please contact [facility] at [phone] with questions or concerns regarding your invoice.   Our billing staff will not be able to assist you with questions regarding bills from these companies.  You will be contacted with the lab results as soon as they are available. The fastest way to get your results is to activate your My Chart account. Instructions are located on the last page of this paperwork. If you have not heard from us regarding the results in 2 weeks, please contact this office.      Ankle Sprain An ankle sprain is a stretch or tear in one of the tough tissues (ligaments) in your ankle. Follow these instructions at home:  Rest your ankle.  Take over-the-counter and prescription medicines only as told by your doctor.  For 2-3 days, keep your ankle higher than the level of your heart (elevated) as much as possible.  If directed, put ice on the area: ? Put ice in a plastic bag. ? Place a towel between your skin and the bag. ? Leave the ice on for 20 minutes, 2-3 times a day.  If you were given a brace: ? Wear it as told. ? Take it off to shower or bathe. ? Try not to move your ankle much, but wiggle your toes from time to time. This helps to prevent swelling.  If you were given an elastic bandage (dressing): ? Take it off when you shower or bathe. ? Try not to move your ankle much, but wiggle your toes from time to time. This helps to prevent swelling. ? Adjust the bandage to make it more comfortable if it feels too tight. ? Loosen the bandage if you lose feeling in your foot, your foot tingles, or your foot gets cold and blue.  If you have crutches, use them as told by your doctor. Continue to use  them until you can walk without feeling pain in your ankle. Contact a doctor if:  Your bruises or swelling are quickly getting worse.  Your pain does not get better after you take medicine. Get help right away if:  You cannot feel your toes or foot.  Your toes or your foot looks blue.  You have very bad pain that gets worse. This information is not intended to replace advice given to you by your health care provider. Make sure you discuss any questions you have with your health care provider. Document Released: 11/30/2007 Document Revised: 11/19/2015 Document Reviewed: 01/13/2015 Elsevier Interactive Patient Education  2018 [employer]  

## 2017-04-12 NOTE — Progress Notes (Signed)
Offie Havlik 56 y.o.   Chief Complaint  Patient presents with  . Follow-up    RIGHT SHOULDER and RIGHT ANKLE    HISTORY OF PRESENT ILLNESS: This is a 56 y.o. male complaining of persistent pain to right ankle/foot and right shoulder since injury sustained last August. Seen by me last 9/11. Needs work note.  HPI   Prior to Admission medications   Medication Sig Start Date End Date Taking? Authorizing Provider  IBUPROFEN PO Take by mouth.   Yes [provider]  lisinopril-hydrochlorothiazide (PRINZIDE,ZESTORETIC) 10-12.5 MG per tablet Take 1 tablet by mouth daily. 04/11/14  Yes Shade Flood, MD    No Known Allergies  Patient Active Problem List   Diagnosis Date Noted  . Acute pain of right shoulder 03/07/2017  . Acute right ankle pain 03/07/2017  . Right foot pain 03/07/2017  . Sprain of shoulder, right 03/07/2017  . Sprain of right ankle 03/07/2017  . Essential hypertension, benign 10/29/2012    Past Medical History:  Diagnosis Date  . Allergy   . Hypertension     Past Surgical History:  Procedure Laterality Date  . KNEE ARTHROSCOPY WITH MENISCAL REPAIR Left 2011   during [place] deployment    Social History   Social History  . Marital status: Married    Spouse name: Warnell Bureau  . Number of children: 4  . Years of education: 12th grade   Occupational History  . Curator    Social History Main Topics  . Smoking status: Never Smoker  . Smokeless tobacco: Never Used  . Alcohol use 1.2 oz/week    2 Standard drinks or equivalent per week     Comment: socially; once a week  . Drug use: No  . Sexual activity: Not on file   Other Topics Concern  . Not on file   Social History Narrative   Lives with his wife and their 4 sons.    No family history on file.   Review of Systems  Constitutional: Negative for chills and fever.  Respiratory: Negative for shortness of breath.   Cardiovascular: Negative for chest pain.  Gastrointestinal: Negative  for nausea and vomiting.  Musculoskeletal: Positive for joint pain.  Skin: Negative for rash.  Neurological: Negative for tingling, sensory change and focal weakness.  All other systems reviewed and are negative.  Vitals:   04/12/17 1152  BP: 120/72  Pulse: 86  Resp: 16  Temp: 98.6 F (37 C)  SpO2: 98%    Physical Exam  Constitutional: He is oriented to person, place, and time. He appears well-developed and well-nourished.  HENT:  Head: Normocephalic and atraumatic.  Eyes: Pupils are equal, round, and reactive to light.  Neck: Normal range of motion.  Cardiovascular: Normal rate and regular rhythm.   Pulmonary/Chest: Effort normal.  Musculoskeletal:  Right shoulder: FROM, non-tender Right ankle/foot: no erythema or swelling; mild dorsal tenderness; FROM, NVI  Neurological: He is alert and oriented to person, place, and time.  Skin: Skin is warm and dry. Capillary refill takes less than 2 seconds. No rash noted.  Psychiatric: He has a normal mood and affect. His behavior is normal.  Vitals reviewed.    ASSESSMENT & PLAN: Deshay was seen today for follow-up.  Diagnoses and all orders for this visit:  Sprain of right ankle, unspecified ligament, subsequent encounter  Need for prophylactic vaccination and inoculation against influenza -     Flu Vaccine QUAD 36+ mos IM  Pain in joint involving right ankle and foot -  ibuprofen (ADVIL,MOTRIN) 600 MG tablet; Take 1 tablet (600 mg total) by mouth every 8 (eight) hours as needed.  Acute pain of right shoulder   Work note printed.  Patient Instructions       IF you received an x-ray today, you will receive an invoice from [facility]. Please contact [facility] at [phone] with questions or concerns regarding your invoice.   IF you received labwork today, you will receive an invoice from [facility]. Please contact [facility] at [phone] with questions or concerns regarding your invoice.   Our  billing staff will not be able to assist you with questions regarding bills from these companies.  You will be contacted with the lab results as soon as they are available. The fastest way to get your results is to activate your My Chart account. Instructions are located on the last page of this paperwork. If you have not heard from us regarding the results in 2 weeks, please contact this office.     Ankle Sprain An ankle sprain is a stretch or tear in one of the tough tissues (ligaments) in your ankle. Follow these instructions at home:  Rest your ankle.  Take over-the-counter and prescription medicines only as told by your doctor.  For 2-3 days, keep your ankle higher than the level of your heart (elevated) as much as possible.  If directed, put ice on the area: ? Put ice in a plastic bag. ? Place a towel between your skin and the bag. ? Leave the ice on for 20 minutes, 2-3 times a day.  If you were given a brace: ? Wear it as told. ? Take it off to shower or bathe. ? Try not to move your ankle much, but wiggle your toes from time to time. This helps to prevent swelling.  If you were given an elastic bandage (dressing): ? Take it off when you shower or bathe. ? Try not to move your ankle much, but wiggle your toes from time to time. This helps to prevent swelling. ? Adjust the bandage to make it more comfortable if it feels too tight. ? Loosen the bandage if you lose feeling in your foot, your foot tingles, or your foot gets cold and blue.  If you have crutches, use them as told by your doctor. Continue to use them until you can walk without feeling pain in your ankle. Contact a doctor if:  Your bruises or swelling are quickly getting worse.  Your pain does not get better after you take medicine. Get help right away if:  You cannot feel your toes or foot.  Your toes or your foot looks blue.  You have very bad pain that gets worse. This information is not intended to  replace advice given to you by your health care provider. Make sure you discuss any questions you have with your health care provider. Document Released: 11/30/2007 Document Revised: 11/19/2015 Document Reviewed: 01/13/2015 Elsevier Interactive Patient Education  2018 Elsevier Inc.      Edwina BarthMiguel Prachi Oftedahl, MD [facility]

## 2017-04-27 ENCOUNTER — Telehealth: Payer: Self-pay | Admitting: Family Medicine

## 2017-04-27 NOTE — Telephone Encounter (Signed)
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

## 2017-04-27 NOTE — Telephone Encounter (Signed)
Please advise 

## 2017-04-27 NOTE — Telephone Encounter (Signed)
He can have the note.

## 2017-04-29 ENCOUNTER — Ambulatory Visit: Payer: No Typology Code available for payment source | Admitting: Physician Assistant

## 2017-05-02 NOTE — Telephone Encounter (Signed)
Please see note.

## 2017-07-13 DIAGNOSIS — B372 Candidiasis of skin and nail: Secondary | ICD-10-CM | POA: Insufficient documentation

## 2017-09-20 DIAGNOSIS — L299 Pruritus, unspecified: Secondary | ICD-10-CM | POA: Insufficient documentation

## 2018-01-02 ENCOUNTER — Ambulatory Visit: Payer: No Typology Code available for payment source | Admitting: Emergency Medicine

## 2018-10-09 ENCOUNTER — Other Ambulatory Visit: Payer: Self-pay

## 2018-10-09 DIAGNOSIS — Z13228 Encounter for screening for other metabolic disorders: Secondary | ICD-10-CM

## 2018-10-09 DIAGNOSIS — Z1329 Encounter for screening for other suspected endocrine disorder: Secondary | ICD-10-CM

## 2018-10-09 DIAGNOSIS — Z13220 Encounter for screening for lipoid disorders: Secondary | ICD-10-CM

## 2018-10-09 DIAGNOSIS — Z130 Encounter for screening for diseases of the blood and blood-forming organs and certain disorders involving the immune mechanism: Secondary | ICD-10-CM

## 2018-10-09 DIAGNOSIS — Z1389 Encounter for screening for other disorder: Secondary | ICD-10-CM

## 2018-10-17 ENCOUNTER — Other Ambulatory Visit: Payer: Self-pay

## 2018-10-17 ENCOUNTER — Ambulatory Visit (INDEPENDENT_AMBULATORY_CARE_PROVIDER_SITE_OTHER): Payer: No Typology Code available for payment source | Admitting: Family Medicine

## 2018-10-17 DIAGNOSIS — Z13220 Encounter for screening for lipoid disorders: Secondary | ICD-10-CM

## 2018-10-17 DIAGNOSIS — Z130 Encounter for screening for diseases of the blood and blood-forming organs and certain disorders involving the immune mechanism: Secondary | ICD-10-CM

## 2018-10-17 DIAGNOSIS — Z1389 Encounter for screening for other disorder: Secondary | ICD-10-CM | POA: Diagnosis not present

## 2018-10-17 DIAGNOSIS — Z1329 Encounter for screening for other suspected endocrine disorder: Secondary | ICD-10-CM

## 2018-10-17 DIAGNOSIS — Z13228 Encounter for screening for other metabolic disorders: Secondary | ICD-10-CM

## 2018-10-17 LAB — POCT URINALYSIS DIP (MANUAL ENTRY)
Bilirubin, UA: NEGATIVE
Blood, UA: NEGATIVE
Glucose, UA: NEGATIVE mg/dL
Ketones, POC UA: NEGATIVE mg/dL
Leukocytes, UA: NEGATIVE
Nitrite, UA: NEGATIVE
Protein Ur, POC: NEGATIVE mg/dL
Spec Grav, UA: 1.025 (ref 1.010–1.025)
Urobilinogen, UA: 0.2 E.U./dL
pH, UA: 6.5 (ref 5.0–8.0)

## 2018-10-17 NOTE — Progress Notes (Signed)
Nurse visit for lab only 

## 2018-10-18 LAB — LIPID PANEL
Chol/HDL Ratio: 3.4 ratio (ref 0.0–5.0)
Cholesterol, Total: 191 mg/dL (ref 100–199)
HDL: 57 mg/dL (ref >39)
LDL Calculated: 122 mg/dL — ABNORMAL HIGH (ref 0–99)
Triglycerides: 61 mg/dL (ref 0–149)
VLDL Cholesterol Cal: 12 mg/dL (ref 5–40)

## 2018-10-18 LAB — COMPREHENSIVE METABOLIC PANEL
ALT: 12 IU/L (ref 0–44)
AST: 17 IU/L (ref 0–40)
Albumin/Globulin Ratio: 1.7 (ref 1.2–2.2)
Albumin: 4.6 g/dL (ref 3.8–4.9)
Alkaline Phosphatase: 43 IU/L (ref 39–117)
BUN/Creatinine Ratio: 10 (ref 9–20)
BUN: 11 mg/dL (ref 6–24)
Bilirubin Total: 0.8 mg/dL (ref 0.0–1.2)
CO2: 23 mmol/L (ref 20–29)
Calcium: 10 mg/dL (ref 8.7–10.2)
Chloride: 99 mmol/L (ref 96–106)
Creatinine, Ser: 1.06 mg/dL (ref 0.76–1.27)
GFR calc Af Amer: 88 mL/min/{1.73_m2} (ref >59)
GFR calc non Af Amer: 76 mL/min/{1.73_m2} (ref >59)
Globulin, Total: 2.7 g/dL (ref 1.5–4.5)
Glucose: 116 mg/dL — ABNORMAL HIGH (ref 65–99)
Potassium: 4.1 mmol/L (ref 3.5–5.2)
Sodium: 139 mmol/L (ref 134–144)
Total Protein: 7.3 g/dL (ref 6.0–8.5)

## 2018-10-18 LAB — CBC WITH DIFFERENTIAL/PLATELET
Basophils Absolute: 0.1 10*3/uL (ref 0.0–0.2)
Basos: 1 %
EOS (ABSOLUTE): 0.2 10*3/uL (ref 0.0–0.4)
Eos: 3 %
Hematocrit: 43.1 % (ref 37.5–51.0)
Hemoglobin: 14.9 g/dL (ref 13.0–17.7)
Immature Grans (Abs): 0 10*3/uL (ref 0.0–0.1)
Immature Granulocytes: 0 %
Lymphocytes Absolute: 2.2 10*3/uL (ref 0.7–3.1)
Lymphs: 37 %
MCH: 30.8 pg (ref 26.6–33.0)
MCHC: 34.6 g/dL (ref 31.5–35.7)
MCV: 89 fL (ref 79–97)
Monocytes Absolute: 0.5 10*3/uL (ref 0.1–0.9)
Monocytes: 8 %
Neutrophils Absolute: 3 10*3/uL (ref 1.4–7.0)
Neutrophils: 51 %
Platelets: 298 10*3/uL (ref 150–450)
RBC: 4.84 x10E6/uL (ref 4.14–5.80)
RDW: 12.2 % (ref 11.6–15.4)
WBC: 6 10*3/uL (ref 3.4–10.8)

## 2018-10-18 LAB — TSH: TSH: 2.49 u[IU]/mL (ref 0.450–4.500)

## 2018-10-19 ENCOUNTER — Other Ambulatory Visit: Payer: Self-pay

## 2018-10-19 ENCOUNTER — Telehealth (INDEPENDENT_AMBULATORY_CARE_PROVIDER_SITE_OTHER): Payer: No Typology Code available for payment source | Admitting: Family Medicine

## 2018-10-19 DIAGNOSIS — F458 Other somatoform disorders: Secondary | ICD-10-CM

## 2018-10-19 DIAGNOSIS — I10 Essential (primary) hypertension: Secondary | ICD-10-CM | POA: Diagnosis not present

## 2018-10-19 MED ORDER — LISINOPRIL-HYDROCHLOROTHIAZIDE 10-12.5 MG PO TABS: 1.0000 | ORAL_TABLET | Freq: Every day | ORAL | 1 refills | Status: DC

## 2018-10-19 NOTE — Progress Notes (Signed)
Virtual Visit Note  I connected with patient on 10/19/18 at 325pm by telephone and verified that I am speaking with the correct person using two identifiers. Isaiah Fowler geologist is currently located at home and patient is currently with them during visit. The provider, Myles Lipps, MD is located in their office at time of visit.  I discussed the limitations, risks, security and privacy concerns of performing an evaluation and management service by telephone and the availability of in person appointments. I also discussed with the patient that there may be a patient responsible charge related to this service. The patient expressed understanding and agreed to proceed.   CC: establish care  HPI  58 yo Male with PMH of HTN and neurogenic pruritis who is establishing care  [facility] Last OV Oct 2019?  He has been doing really since his last OV He is still working as Armed forces technical officer reserve currently, previously active duty in [place]  Patient reports colonoscopy ~ 2012 thru [place], reports 2 polyps, not sure when to repeat  Needs refill of BP med  No Known Allergies  Prior to Admission medications   Medication Sig Start Date End Date Taking? Authorizing Provider  chlorhexidine (HIBICLENS) 4 % external liquid  08/31/18  Yes [provider]  clobetasol cream (TEMOVATE) 0.05 % Apply to thickened areas twice a day for two weeks and then as needed (not to the face) 03/30/18  Yes [provider]  ibuprofen (ADVIL,MOTRIN) 600 MG tablet Take 1 tablet (600 mg total) by mouth every 8 (eight) hours as needed. 04/12/17  Yes Sagardia, Eilleen Kempf, MD  lisinopril-hydrochlorothiazide (PRINZIDE,ZESTORETIC) 10-12.5 MG per tablet Take 1 tablet by mouth daily. 04/11/14  Yes Shade Flood, MD  nystatin (MYCOSTATIN/NYSTOP) powder Apply to affected area twice a day as needed 07/13/17  Yes [provider]  triamcinolone cream (KENALOG) 0.1 %  06/08/18  Yes  [provider]    Past Medical History:  Diagnosis Date  . Allergy   . Hypertension     Past Surgical History:  Procedure Laterality Date  . KNEE ARTHROSCOPY WITH MENISCAL REPAIR Left 2011   during [place] deployment    Social History   Tobacco Use  . Smoking status: Never Smoker  . Smokeless tobacco: Never Used  Substance Use Topics  . Alcohol use: Yes    Alcohol/week: 2.0 standard drinks    Types: 2 Standard drinks or equivalent per week    Comment: socially; once a week    No family history on file.  ROS  Denies CP, palpitations, edema, SOB Per hpi  Objective  Vitals as reported by the patient:  Results for orders placed or performed in visit on 10/17/18 (from the past 72 hour(s))  POCT urinalysis dipstick     Status: None   Collection Time: 10/17/18  8:58 AM  Result Value Ref Range   Color, UA yellow yellow   Clarity, UA clear clear   Glucose, UA negative negative mg/dL   Bilirubin, UA negative negative   Ketones, POC UA negative negative mg/dL   Spec Grav, UA 4.037 5.436 - 1.025   Blood, UA negative negative   pH, UA 6.5 5.0 - 8.0   Protein Ur, POC negative negative mg/dL   Urobilinogen, UA 0.2 0.2 or 1.0 E.U./dL   Nitrite, UA Negative Negative   Leukocytes, UA Negative Negative  Lipid panel     Status: Abnormal   Collection Time: 10/17/18 10:11 AM  Result  Value Ref Range   Cholesterol, Total 191 100 - 199 mg/dL   Triglycerides 61 0 - 149 mg/dL   HDL 57 >16>39 mg/dL   VLDL Cholesterol Cal 12 5 - 40 mg/dL   LDL Calculated 109122 (H) 0 - 99 mg/dL   Chol/HDL Ratio 3.4 0.0 - 5.0 ratio    Comment:                                   T. Chol/HDL Ratio                                             Men  Women                               1/2 Avg.Risk  3.4    3.3                                   Avg.Risk  5.0    4.4                                2X Avg.Risk  9.6    7.1                                3X Avg.Risk 23.4   11.0   TSH     Status: None    Collection Time: 10/17/18 10:11 AM  Result Value Ref Range   TSH 2.490 0.450 - 4.500 uIU/mL  Comprehensive metabolic panel     Status: Abnormal   Collection Time: 10/17/18 10:11 AM  Result Value Ref Range   Glucose 116 (H) 65 - 99 mg/dL   BUN 11 6 - 24 mg/dL   Creatinine, Ser 6.041.06 0.76 - 1.27 mg/dL   GFR calc non Af Amer 76 >59 mL/min/1.73   GFR calc Af Amer 88 >59 mL/min/1.73   BUN/Creatinine Ratio 10 9 - 20   Sodium 139 134 - 144 mmol/L   Potassium 4.1 3.5 - 5.2 mmol/L   Chloride 99 96 - 106 mmol/L   CO2 23 20 - 29 mmol/L   Calcium 10.0 8.7 - 10.2 mg/dL   Total Protein 7.3 6.0 - 8.5 g/dL   Albumin 4.6 3.8 - 4.9 g/dL   Globulin, Total 2.7 1.5 - 4.5 g/dL   Albumin/Globulin Ratio 1.7 1.2 - 2.2   Bilirubin Total 0.8 0.0 - 1.2 mg/dL   Alkaline Phosphatase 43 39 - 117 IU/L   AST 17 0 - 40 IU/L   ALT 12 0 - 44 IU/L  CBC with Differential/Platelet     Status: None   Collection Time: 10/17/18 10:11 AM  Result Value Ref Range   WBC 6.0 3.4 - 10.8 x10E3/uL   RBC 4.84 4.14 - 5.80 x10E6/uL   Hemoglobin 14.9 13.0 - 17.7 g/dL   Hematocrit 54.043.1 98.137.5 - 51.0 %   MCV 89 79 - 97 fL   MCH 30.8 26.6 - 33.0 pg   MCHC 34.6 31.5 - 35.7 g/dL   RDW 19.112.2 47.811.6 - 29.515.4 %   Platelets  298 150 - 450 x10E3/uL   Neutrophils 51 Not Estab. %   Lymphs 37 Not Estab. %   Monocytes 8 Not Estab. %   Eos 3 Not Estab. %   Basos 1 Not Estab. %   Neutrophils Absolute 3.0 1.4 - 7.0 x10E3/uL   Lymphocytes Absolute 2.2 0.7 - 3.1 x10E3/uL   Monocytes Absolute 0.5 0.1 - 0.9 x10E3/uL   EOS (ABSOLUTE) 0.2 0.0 - 0.4 x10E3/uL   Basophils Absolute 0.1 0.0 - 0.2 x10E3/uL   Immature Granulocytes 0 Not Estab. %   Immature Grans (Abs) 0.0 0.0 - 0.1 x10E3/uL   10 year ASCVD risk of 5.9% BP at last OV 122/78  ASSESSMENT and PLAN  1. Essential hypertension, benign Controlled. Continue current regime.   2. 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 Patient unsure of what medication he uses as needed. He will  bring for [place] to update records  3. Essential hypertension - lisinopril-hydrochlorothiazide (ZESTORETIC) 10-12.5 MG tablet; Take 1 tablet by mouth daily.  Other orders  FOLLOW-UP: 3 months, bring colonoscopy report   The above assessment and management plan was discussed with the patient. The patient verbalized understanding of and has agreed to the management plan. Patient is aware to call the clinic if symptoms persist or worsen. Patient is aware when to return to the clinic for a follow-up visit. Patient educated on when it is appropriate to go to the emergency department.    I provided 13 minutes of non-face-to-face time during this encounter.  Myles Lipps, MD Primary Care at [facility] [address] [place], [place] [address] Ph.  [phone] Fax [phone]

## 2018-10-19 NOTE — Progress Notes (Signed)
Establishing care.  Cell phone

## 2019-01-18 ENCOUNTER — Ambulatory Visit: Payer: No Typology Code available for payment source | Admitting: Family Medicine

## 2019-02-20 ENCOUNTER — Other Ambulatory Visit: Payer: Self-pay

## 2019-02-20 ENCOUNTER — Encounter: Payer: Self-pay | Admitting: Registered Nurse

## 2019-02-20 ENCOUNTER — Ambulatory Visit: Payer: No Typology Code available for payment source | Admitting: Registered Nurse

## 2019-02-20 VITALS — BP 132/80 | HR 61 | Temp 98.0°F | Resp 16 | Wt 170.0 lb

## 2019-02-20 DIAGNOSIS — Z23 Encounter for immunization: Secondary | ICD-10-CM

## 2019-02-20 DIAGNOSIS — L309 Dermatitis, unspecified: Secondary | ICD-10-CM | POA: Insufficient documentation

## 2019-02-20 MED ORDER — TACROLIMUS 0.1 % EX CREA: 2.0000 "application " | TOPICAL_CREAM | Freq: Every day | CUTANEOUS | 1 refills | Status: AC

## 2019-02-20 NOTE — Progress Notes (Signed)
Acute Office Visit  Subjective:    Patient ID: Isaiah Fowler, male    DOB: 05/11/1961, 58 y.o.   MRN: [id number]  Chief Complaint  Patient presents with  . Rash    pt states he has been having rash on his hands and groin area x 1 month     HPI Patient is in today for rash on hands and groin  [facility] given nystatin powder and clobetasol cream, have been somewhat helpful  Onset 1 mo prior, he wears gloves frequently at work, he sweats frequently at work. He has had dry skin, itching, blistering between fingers, and itchy rash with some bumps in groin. Groin rash has improved. Hands have not. Has tried OTC lotion as well. Sweats in gloves at work which worsens itching.   No systemic symptoms. No history of eczema or psoriasis that he is aware of.   Past Medical History:  Diagnosis Date  . Allergy   . Hypertension     Past Surgical History:  Procedure Laterality Date  . KNEE ARTHROSCOPY WITH MENISCAL REPAIR Left 2011   during [place] deployment    History reviewed. No pertinent family history.  Social History   Socioeconomic History  . Marital status: Married    Spouse name: Warnell Bureau  . Number of children: 4  . Years of education: 12th grade  . Highest education level: Not on file  Occupational History  . Occupation: Curator  Social Needs  . Financial resource strain: Not on file  . Food insecurity    Worry: Not on file    Inability: Not on file  . Transportation needs    Medical: Not on file    Non-medical: Not on file  Tobacco Use  . Smoking status: Never Smoker  . Smokeless tobacco: Never Used  Substance and Sexual Activity  . Alcohol use: Yes    Alcohol/week: 2.0 standard drinks    Types: 2 Standard drinks or equivalent per week    Comment: socially; once a week  . Drug use: No  . Sexual activity: Not on file  Lifestyle  . Physical activity    Days per week: Not on file    Minutes per session: Not on file  . Stress: Not on file   Relationships  . Social Musician on phone: Not on file    Gets together: Not on file    Attends religious service: Not on file    Active member of club or organization: Not on file    Attends meetings of clubs or organizations: Not on file    Relationship status: Not on file  . Intimate partner violence    Fear of current or ex partner: Not on file    Emotionally abused: Not on file    Physically abused: Not on file    Forced sexual activity: Not on file  Other Topics Concern  . Not on file  Social History Narrative   Lives with his wife and their 4 sons.    Outpatient Medications Prior to Visit  Medication Sig Dispense Refill  . atorvastatin (LIPITOR) 80 MG tablet     . chlorhexidine (HIBICLENS) 4 % external liquid     . clobetasol cream (TEMOVATE) 0.05 % Apply to thickened areas twice a day for two weeks and then as needed (not to the face)    . hydrOXYzine (ATARAX/VISTARIL) 25 MG tablet     . lisinopril-hydrochlorothiazide (ZESTORETIC) 10-12.5 MG tablet  Take 1 tablet by mouth daily. 90 tablet 1   No facility-administered medications prior to visit.     No Known Allergies  ROS Per hpi     Objective:    Physical Exam  Constitutional: He is oriented to person, place, and time. He appears well-developed and well-nourished. No distress.  Cardiovascular: Normal rate and regular rhythm.  Pulmonary/Chest: Effort normal. No respiratory distress.  Neurological: He is alert and oriented to person, place, and time.  Skin: Skin is warm and dry. Rash noted. He is not diaphoretic. There is erythema.     Psychiatric: He has a normal mood and affect. His behavior is normal. Judgment and thought content normal.  Nursing note and vitals reviewed.   BP 132/80   Pulse 61   Temp 98 F (36.7 C) (Oral)   Resp 16   Wt 170 lb (77.1 kg)   SpO2 96%   BMI 26.23 kg/m  Wt Readings from Last 3 Encounters:  02/20/19 170 lb (77.1 kg)  04/12/17 171 lb 6.4 oz (77.7 kg)   03/07/17 174 lb (78.9 kg)    Health Maintenance Due  Topic Date Due  . INFLUENZA VACCINE  01/26/2019    There are no preventive care reminders to display for this patient.   Lab Results  Component Value Date   TSH 2.490 10/17/2018   Lab Results  Component Value Date   WBC 6.0 10/17/2018   HGB 14.9 10/17/2018   HCT 43.1 10/17/2018   MCV 89 10/17/2018   PLT 298 10/17/2018   Lab Results  Component Value Date   NA 139 10/17/2018   K 4.1 10/17/2018   CO2 23 10/17/2018   GLUCOSE 116 (H) 10/17/2018   BUN 11 10/17/2018   CREATININE 1.06 10/17/2018   BILITOT 0.8 10/17/2018   ALKPHOS 43 10/17/2018   AST 17 10/17/2018   ALT 12 10/17/2018   PROT 7.3 10/17/2018   ALBUMIN 4.6 10/17/2018   CALCIUM 10.0 10/17/2018   Lab Results  Component Value Date   CHOL 191 10/17/2018   Lab Results  Component Value Date   HDL 57 10/17/2018   Lab Results  Component Value Date   LDLCALC 122 (H) 10/17/2018   Lab Results  Component Value Date   TRIG 61 10/17/2018   Lab Results  Component Value Date   CHOLHDL 3.4 10/17/2018   No results found for: HGBA1C     Assessment & Plan:   Problem List Items Addressed This Visit      Other   Need for prophylactic vaccination and inoculation against influenza - Primary   Relevant Orders   Flu Vaccine QUAD 36+ mos IM (Completed)    Other Visit Diagnoses    Eczema, unspecified type       Relevant Medications   Tacrolimus 0.1 % CREA   Other Relevant Orders   Ambulatory referral to Dermatology       Meds ordered this encounter  Medications  . Tacrolimus 0.1 % CREA    Sig: Apply 2 application topically daily.    Dispense:  30 g    Refill:  1    Order Specific Question:   Supervising Provider    Answer:   Forrest Moron [id number]    PLAN  Tacrolimus 0.1% cream apply twice daily to hands  Continue nystatin and clobetasol  Derm referral - without major improvement, may warrant further management or immunosuppressing tx.    Nonpharm: using oil based, emollient lotions rather than water based, wearing  socks/gloves over hands with lotion while sleeping, washing with warm rather than hot water, soap, using calamine lotion with phenol for itch relief  Patient encouraged to call clinic with any questions, comments, or concerns.   Janeece Ageeichard Jazz Biddy, NP

## 2019-02-20 NOTE — Patient Instructions (Signed)
° ° ° °  If you have lab work done today you will be contacted with your lab results within the next 2 weeks.  If you have not heard from us then please contact us. The fastest way to get your results is to register for My Chart. ° ° °IF you received an x-ray today, you will receive an invoice from [facility]. Please contact [facility] at [phone] with questions or concerns regarding your invoice.  ° °IF you received labwork today, you will receive an invoice from [facility]. Please contact [facility] at [phone] with questions or concerns regarding your invoice.  ° °Our billing staff will not be able to assist you with questions regarding bills from these companies. ° °You will be contacted with the lab results as soon as they are available. The fastest way to get your results is to activate your My Chart account. Instructions are located on the last page of this paperwork. If you have not heard from us regarding the results in 2 weeks, please contact this office. °  ° ° ° °

## 2019-02-25 ENCOUNTER — Other Ambulatory Visit: Payer: Self-pay | Admitting: Registered Nurse

## 2019-02-25 ENCOUNTER — Telehealth: Payer: Self-pay | Admitting: Registered Nurse

## 2019-02-25 ENCOUNTER — Telehealth: Payer: Self-pay

## 2019-02-25 DIAGNOSIS — B352 Tinea manuum: Secondary | ICD-10-CM

## 2019-02-25 MED ORDER — TERBINAFINE HCL 250 MG PO TABS: 250.0000 mg | ORAL_TABLET | Freq: Every day | ORAL | 0 refills | Status: DC

## 2019-02-25 NOTE — Telephone Encounter (Signed)
Pt is wanting something  Oral. Pt stated that they discussed on visit  Cream that he is using is not working for rash the patient would like Dr to call in oral medication FR please call pt at [phone]

## 2019-02-25 NOTE — Telephone Encounter (Signed)
Pt called to request oral meds for eczema, rich sent meds to the pharmacy

## 2019-02-25 NOTE — Telephone Encounter (Signed)
Pt would like script sent to Pharmacy:  [facility], [place]. He is back in office. He would love an oral not a cream. Please advise at [phone]

## 2019-02-25 NOTE — Telephone Encounter (Signed)
Patient called checking status on new medication.  Patient states the cream is also making his itching worse.  Patient also states he is very uncomfortable.  Patient call back [phone]

## 2019-02-27 ENCOUNTER — Other Ambulatory Visit: Payer: Self-pay | Admitting: Registered Nurse

## 2019-02-27 DIAGNOSIS — B352 Tinea manuum: Secondary | ICD-10-CM

## 2019-02-27 MED ORDER — TERBINAFINE HCL 250 MG PO TABS: 250.0000 mg | ORAL_TABLET | Freq: Every day | ORAL | 0 refills | Status: AC

## 2019-02-27 NOTE — Progress Notes (Signed)
Terbinafine order sent to [facility]. Take one daily for 14 days Return to clinic if symptoms worsen Follow up with dermatology  Kathrin Ruddy, NP

## 2019-02-27 NOTE — Telephone Encounter (Signed)
Will send terbinafine 250mg  PO qd for 2 weeks. Suspecting fungal infection at this time, this should provide relief. Secondary bacterial infection possible, if other symptoms arise, any changes to hands, or other concerns, please return to our clinic. Dermatology referral placed. Following up with them would be ideal, as this appears to be a relatively intense version of this condition.  If you wouldn't mind giving him a call to let him know, that would be great.   Thank you Kathrin Ruddy, NP

## 2019-02-27 NOTE — Telephone Encounter (Signed)
Please advise 

## 2019-03-05 NOTE — Telephone Encounter (Signed)
Informed pt wife that Rx was at the pharmacy, she verbalized understanding and states he will follow-up in 2 weeks.

## 2019-03-25 ENCOUNTER — Ambulatory Visit: Payer: No Typology Code available for payment source | Admitting: Family Medicine

## 2019-03-25 ENCOUNTER — Ambulatory Visit (INDEPENDENT_AMBULATORY_CARE_PROVIDER_SITE_OTHER): Payer: No Typology Code available for payment source

## 2019-03-25 ENCOUNTER — Other Ambulatory Visit: Payer: Self-pay

## 2019-03-25 ENCOUNTER — Encounter: Payer: Self-pay | Admitting: Family Medicine

## 2019-03-25 VITALS — BP 140/82 | HR 62 | Temp 98.8°F | Ht 67.5 in | Wt 175.0 lb

## 2019-03-25 DIAGNOSIS — G8929 Other chronic pain: Secondary | ICD-10-CM

## 2019-03-25 DIAGNOSIS — E7800 Pure hypercholesterolemia, unspecified: Secondary | ICD-10-CM

## 2019-03-25 DIAGNOSIS — I10 Essential (primary) hypertension: Secondary | ICD-10-CM

## 2019-03-25 DIAGNOSIS — M25571 Pain in right ankle and joints of right foot: Secondary | ICD-10-CM | POA: Diagnosis not present

## 2019-03-25 NOTE — Progress Notes (Signed)
9/28/20208:17 AM  Isaiah Fowler 1960-10-31, 58 y.o., male [id number]  Chief Complaint  Patient presents with  . Pain    active reserve, injured in 2018 twisted ankle having pain only on the right side, using heat pad forthe pain    HPI:   Patient is a 58 y.o. male with past medical history significant for HTN, HLP OSA on cpap and neurogenic puritus who presents today for routine followup  Last OV with me in April 2020 - telemedicine  cpap managed by [facility]  Twisted right ankle in 2018 when he was getting off [place] in base Seen in [place] at the time of injury did xrays, normal Since then it when he drives for long periods of time, going up the stairs or running, pain around lateral malleolus, swelling, pain will radiate up his leg into back of thigh, big toe is constantly numb, straightening leg helps with pain Uses ice, APAP and muscle relaxant (given by [place]) [facility] over [facility] related or not in[place] Requesting restrictions  Lab Results  Component Value Date   CREATININE 1.06 10/17/2018   BUN 11 10/17/2018   NA 139 10/17/2018   K 4.1 10/17/2018   CL 99 10/17/2018   CO2 23 10/17/2018   Lab Results  Component Value Date   CHOL 191 10/17/2018   HDL 57 10/17/2018   LDLCALC 122 (H) 10/17/2018   TRIG 61 10/17/2018   CHOLHDL 3.4 10/17/2018   Wt Readings from Last 3 Encounters:  03/25/19 175 lb (79.4 kg)  02/20/19 170 lb (77.1 kg)  04/12/17 171 lb 6.4 oz (77.7 kg)    Depression screen [facility] 2/9 03/25/2019 02/20/2019 04/12/2017  Decreased Interest 0 0 0  Down, Depressed, Hopeless 0 0 0  PHQ - 2 Score 0 0 0    Fall Risk  03/25/2019 02/20/2019 04/12/2017 03/07/2017  Falls in the past year? 0 0 Yes No  Number falls in past yr: 0 - 1 -  Injury with Fall? 0 - Yes -  Comment - - RIGHT SHOUDLER and ANKLE -     No Known Allergies  Prior to Admission medications   Medication Sig Start Date End Date Taking? Authorizing Provider  acetaminophen (TYLENOL) 325 MG  tablet  03/01/19  Yes [provider]  atorvastatin (LIPITOR) 80 MG tablet  11/28/18  Yes [provider]  chlorhexidine (HIBICLENS) 4 % external liquid  08/31/18  Yes [provider]  hydrOXYzine (ATARAX/VISTARIL) 25 MG tablet  11/28/18  Yes [provider]  lisinopril-hydrochlorothiazide (ZESTORETIC) 10-12.5 MG tablet Take 1 tablet by mouth daily. 10/19/18  Yes Rutherford Guys, MD  Tacrolimus 0.1 % CREA Apply 2 application topically daily. 02/20/19  Yes Maximiano Coss, NP  terbinafine (LAMISIL) 250 MG tablet Take 1 tablet (250 mg total) by mouth daily. 02/27/19  Yes Maximiano Coss, NP    Past Medical History:  Diagnosis Date  . Allergy   . Hypertension     Past Surgical History:  Procedure Laterality Date  . KNEE ARTHROSCOPY WITH MENISCAL REPAIR Left 2011   during [place] deployment    Social History   Tobacco Use  . Smoking status: Never Smoker  . Smokeless tobacco: Never Used  Substance Use Topics  . Alcohol use: Yes    Alcohol/week: 2.0 standard drinks    Types: 2 Standard drinks or equivalent per week    Comment: socially; once a week    History reviewed. No pertinent family history.  Review of Systems  Constitutional: Negative for chills and fever.  Respiratory: Negative for cough and shortness of breath.   Cardiovascular: Negative for chest pain, palpitations and leg swelling.  Gastrointestinal: Negative for abdominal pain, nausea and vomiting.   Per hpi  OBJECTIVE:  Today's Vitals   03/25/19 0813 03/25/19 0820  BP: (!) 162/87 140/82  Pulse: 62   Temp: 98.8 F (37.1 C)   SpO2: 98%   Weight: 175 lb (79.4 kg)   Height: 5' 7.5" (1.715 m)    Body mass index is 27 kg/m.  BP Readings from Last 3 Encounters:  03/25/19 (!) 162/87  02/20/19 132/80  04/12/17 120/72    Physical Exam Vitals signs and nursing note reviewed.  Constitutional:      Appearance: He is well-developed.  HENT:     Head: Normocephalic and atraumatic.   Eyes:     Conjunctiva/sclera: Conjunctivae normal.     Pupils: Pupils are equal, round, and reactive to light.  Neck:     Musculoskeletal: Neck supple.  Cardiovascular:     Rate and Rhythm: Normal rate and regular rhythm.     Heart sounds: No murmur. No friction rub. No gallop.   Pulmonary:     Effort: Pulmonary effort is normal.     Breath sounds: Normal breath sounds. No wheezing or rales.  Musculoskeletal:     Right ankle: He exhibits normal range of motion, no swelling, no deformity and normal pulse. Tenderness. Lateral malleolus and CF ligament tenderness found.  Skin:    General: Skin is warm and dry.  Neurological:     Mental Status: He is alert and oriented to person, place, and time.     No results found for this or any previous visit (from the past 24 hour(s)).  Dg Ankle Complete Right  Result Date: 03/25/2019 CLINICAL DATA:  Pain and swelling EXAM: RIGHT ANKLE - COMPLETE 3+ VIEW COMPARISON:  None. FINDINGS: Frontal, oblique, and lateral views obtained. There is no evident acute fracture or joint effusion. A small calcification in the lateral malleolus appears well corticated and may represent residua of old trauma. There is slight joint space narrowing medially. The remainder of the ankle joint region appears unremarkable. No erosive changes. There is a small inferior calcaneal spur. Ankle mortise appears intact. IMPRESSION: Suspect residua of old trauma in the lateral malleolus. No acute fracture. Ankle mortise appears intact. Mild joint space narrowing medially. Small inferior calcaneal spur. Electronically Signed   By: William  Woodruff III M.D.   On: 03/25/2019 08:42     ASSESSMENT and PLAN  1. Chronic pain of right ankle Xray suggestive of old trauma, referring for further eval and mgt. Defer any appropriate restrictions to them. Cont with RICE therapy. - DG Ankle Complete Right; Future - Ambulatory referral to Orthopedic Surgery  2. Essential hypertension, benign  Controlled. Continue current regime.  - Lipid panel - CMP14+EGFR - Care order/instruction:  3. Pure hypercholesterolemia Checking labs today, medications will be adjusted as needed.   Return in about 6 months (around 09/22/2019) for HTN, Cholesterol.    Irma M Santiago, MD Primary Care at [facility] [address] [place], [place] [address] Ph.  [phone] Fax [phone]   

## 2019-03-25 NOTE — Patient Instructions (Signed)
° ° ° °  If you have lab work done today you will be contacted with your lab results within the next 2 weeks.  If you have not heard from us then please contact us. The fastest way to get your results is to register for My Chart. ° ° °IF you received an x-ray today, you will receive an invoice from [facility]. Please contact [facility] at [phone] with questions or concerns regarding your invoice.  ° °IF you received labwork today, you will receive an invoice from [facility]. Please contact [facility] at [phone] with questions or concerns regarding your invoice.  ° °Our billing staff will not be able to assist you with questions regarding bills from these companies. ° °You will be contacted with the lab results as soon as they are available. The fastest way to get your results is to activate your My Chart account. Instructions are located on the last page of this paperwork. If you have not heard from us regarding the results in 2 weeks, please contact this office. °  ° ° ° °

## 2019-03-26 LAB — CMP14+EGFR
ALT: 15 IU/L (ref 0–44)
AST: 19 IU/L (ref 0–40)
Albumin/Globulin Ratio: 1.8 (ref 1.2–2.2)
Albumin: 4.5 g/dL (ref 3.8–4.9)
Alkaline Phosphatase: 54 IU/L (ref 39–117)
BUN/Creatinine Ratio: 9 — ABNORMAL LOW (ref 10–24)
BUN: 9 mg/dL (ref 8–27)
Bilirubin Total: 0.5 mg/dL (ref 0.0–1.2)
CO2: 23 mmol/L (ref 20–29)
Calcium: 9.5 mg/dL (ref 8.6–10.2)
Chloride: 101 mmol/L (ref 96–106)
Creatinine, Ser: 0.98 mg/dL (ref 0.76–1.27)
GFR calc Af Amer: 96 mL/min/{1.73_m2} (ref >59)
GFR calc non Af Amer: 83 mL/min/{1.73_m2} (ref >59)
Globulin, Total: 2.5 g/dL (ref 1.5–4.5)
Glucose: 83 mg/dL (ref 65–99)
Potassium: 3.9 mmol/L (ref 3.5–5.2)
Sodium: 138 mmol/L (ref 134–144)
Total Protein: 7 g/dL (ref 6.0–8.5)

## 2019-03-26 LAB — LIPID PANEL
Chol/HDL Ratio: 3.6 ratio (ref 0.0–5.0)
Cholesterol, Total: 199 mg/dL (ref 100–199)
HDL: 56 mg/dL (ref >39)
LDL Chol Calc (NIH): 123 mg/dL — ABNORMAL HIGH (ref 0–99)
Triglycerides: 111 mg/dL (ref 0–149)
VLDL Cholesterol Cal: 20 mg/dL (ref 5–40)

## 2019-03-29 ENCOUNTER — Other Ambulatory Visit: Payer: Self-pay

## 2019-03-29 ENCOUNTER — Ambulatory Visit (INDEPENDENT_AMBULATORY_CARE_PROVIDER_SITE_OTHER): Payer: No Typology Code available for payment source | Admitting: Orthopaedic Surgery

## 2019-03-29 ENCOUNTER — Encounter: Payer: Self-pay | Admitting: Orthopaedic Surgery

## 2019-03-29 ENCOUNTER — Ambulatory Visit: Payer: Self-pay

## 2019-03-29 VITALS — Ht 67.0 in | Wt 172.0 lb

## 2019-03-29 DIAGNOSIS — M7671 Peroneal tendinitis, right leg: Secondary | ICD-10-CM

## 2019-03-29 DIAGNOSIS — M5431 Sciatica, right side: Secondary | ICD-10-CM | POA: Diagnosis not present

## 2019-03-29 MED ORDER — PREDNISONE 10 MG (21) PO TBPK: ORAL_TABLET | ORAL | 0 refills | Status: DC

## 2019-03-29 MED ORDER — DICLOFENAC SODIUM 1 % TD GEL: 2.0000 g | Freq: Four times a day (QID) | TRANSDERMAL | 1 refills | Status: AC

## 2019-03-29 MED ORDER — DICLOFENAC SODIUM 75 MG PO TBEC: DELAYED_RELEASE_TABLET | ORAL | 2 refills | Status: DC

## 2019-03-29 NOTE — Progress Notes (Signed)
Office Visit Note   Patient: Isaiah Fowler           Date of Birth: [birth year]           MRN: [id number] Visit Date: 03/29/2019              Requested by: Isaiah Lipps, MD [address] [place],  [place] [address] PCP: Patient, No Pcp Per   Assessment & Plan: Visit Diagnoses:  1. Sciatica, right side   2. Peroneal tendinitis, right leg     Plan: Impression is right ankle peroneal tendinitis and right sided sciatica.  I will call in a Sterapred taper followed by diclofenac.  I will also call in diclofenac gel and start the patient in physical therapy.  We will place him in an ASO brace.  He will follow-up with [place] in 8 weeks time for recheck.  He will call with concerns or questions in the meantime.  Follow-Up Instructions: Return in about 8 weeks (around 05/24/2019).   Orders:  Orders Placed This Encounter  Procedures  . XR Lumbar Spine 2-3 Views   Meds ordered this encounter  Medications  . predniSONE (STERAPRED UNI-PAK 21 TAB) 10 MG (21) TBPK tablet    Sig: Take as directed    Dispense:  21 tablet    Refill:  0  . diclofenac (VOLTAREN) 75 MG EC tablet    Sig: Once finished with steroid taper, can take one pill bid prn pain    Dispense:  60 tablet    Refill:  2  . diclofenac sodium (VOLTAREN) 1 % GEL    Sig: Apply 2 g topically 4 (four) times daily.    Dispense:  150 g    Refill:  1      Procedures: No procedures performed   Clinical Data: No additional findings.   Subjective: Chief Complaint  Patient presents with  . Right Ankle - Pain    HPI patient is a pleasant 58 year old gentleman who is in the military reserve who comes in today with right ankle and leg pain.  This began back in 2018 while on base at [employer].  He was getting out of a car onto uneven gravel when he inverted his right ankle.  He was initially seen at [facility] for this.  Due to him not being active in the [employer] and the fact he is able to walk across the room, no x-rays or further  work-up was obtained.  He had continued pain and was seen by the [place] in 2019.  He had x-rays which demonstrated no injury.  His pain was recently worsened and he was seen at an urgent care setting this past Monday where repeat x-rays of the right ankle were performed.  X-ray showed an old fracture to the distal fibula.  He comes in today for further evaluation and treatment recommendation.  The pain he has is along the peroneal tendon and radiates up the side of his lower leg and into the back of the thigh into the buttocks.  The pain to the ankle area is worse with running and going up and down stairs.  The pain to the back of the leg and buttocks occurs when he is sitting for any period of time.  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  He has tried over-the-counter medications, ice, heat without relief of symptoms.  He is also  starting to notice numbness to the big toe.  No bowel or bladder change and no saddle paresthesias.  No previous history of lumbar pathology.  He is not a diabetic.  Review of Systems as detailed in HPI.  All others reviewed and are negative.   Objective: Vital Signs: Ht 5\' 7"  (1.702 m)   Wt 172 lb (78 kg)   BMI 26.94 kg/m   Physical Exam well-developed and well-nourished gentleman in no acute distress.  Alert and oriented x3.  Ortho Exam examination of his right ankle reveals moderate tenderness along the peroneal tendon.  He has pain and limited range of motion with inversion and eversion.  No bony tenderness.  No tenderness to the medial aspect.  No calf pain.  He has a markedly positive straight leg raise.  No spinous or paraspinous tenderness.  Moderate pain along the sciatic notch.  No focal weakness.  He is neurovascular intact distally.  Specialty Comments:  No specialty comments available.  Imaging: Xr Lumbar Spine 2-3 Views  Result Date: 03/29/2019 Disc space narrowing L3-4.  No other  acute findings.    PMFS History: Patient Active Problem List   Diagnosis Date Noted  . Pure hypercholesterolemia 03/25/2019  . Chronic pain of right ankle 03/25/2019  . Eczema 02/20/2019  . Pruritus 09/20/2017  . Candidiasis, intertrigo 07/13/2017  . Need for prophylactic vaccination and inoculation against influenza 04/12/2017  . Pain in joint involving right ankle and foot 04/12/2017  . Acute pain of right shoulder 03/07/2017  . Acute right ankle pain 03/07/2017  . Right foot pain 03/07/2017  . Sprain of shoulder, right 03/07/2017  . Sprain of right ankle 03/07/2017  . Impotence 01/08/2016  . OSA on CPAP 01/08/2016  . Essential hypertension, benign 10/29/2012   Past Medical History:  Diagnosis Date  . Allergy   . Hypertension     History reviewed. No pertinent family history.  Past Surgical History:  Procedure Laterality Date  . KNEE ARTHROSCOPY WITH MENISCAL REPAIR Left 2011   during [place] deployment   Social History   Occupational History  . Occupation: Dealer  Tobacco Use  . Smoking status: Never Smoker  . Smokeless tobacco: Never Used  Substance and Sexual Activity  . Alcohol use: Yes    Alcohol/week: 2.0 standard drinks    Types: 2 Standard drinks or equivalent per week    Comment: socially; once a week  . Drug use: No  . Sexual activity: Not on file

## 2019-04-08 ENCOUNTER — Encounter: Payer: Self-pay | Admitting: Radiology

## 2019-04-17 ENCOUNTER — Telehealth: Payer: Self-pay | Admitting: Orthopaedic Surgery

## 2019-04-17 NOTE — Telephone Encounter (Signed)
Patient called checking on request for records. I advised him they are ready. I tried to call him 10/19, but na and no vm came on. He is fine and will pick them up some time today.

## 2019-05-28 ENCOUNTER — Other Ambulatory Visit: Payer: Self-pay

## 2019-05-28 ENCOUNTER — Encounter: Payer: Self-pay | Admitting: Orthopaedic Surgery

## 2019-05-28 ENCOUNTER — Ambulatory Visit (INDEPENDENT_AMBULATORY_CARE_PROVIDER_SITE_OTHER): Payer: No Typology Code available for payment source | Admitting: Orthopaedic Surgery

## 2019-05-28 DIAGNOSIS — M7671 Peroneal tendinitis, right leg: Secondary | ICD-10-CM

## 2019-05-28 DIAGNOSIS — M5431 Sciatica, right side: Secondary | ICD-10-CM

## 2019-05-28 NOTE — Progress Notes (Signed)
Office Visit Note   Patient: Isaiah Fowler           Date of Birth: Mar 01, 1961           MRN: [id number] Visit Date: 05/28/2019              Requested by: No referring provider defined for this encounter. PCP: Patient, No Pcp Per   Assessment & Plan: Visit Diagnoses:  1. Sciatica, right side   2. Peroneal tendinitis, right leg     Plan: Given lack of improvement from a Sterapred we will order an MRI of the lumbar spine to assess for structural abnormalities.  In terms of his ankle he would like to give this more time and try physical therapy before considering a cortisone injection.  He will wait for the [place] approval for PT.  We will see him back after the MRI.  Follow-Up Instructions: Return if symptoms worsen or fail to improve.   Orders:  No orders of the defined types were placed in this encounter.  No orders of the defined types were placed in this encounter.     Procedures: No procedures performed   Clinical Data: No additional findings.   Subjective: Chief Complaint  Patient presents with  . Right Ankle - Pain, Follow-up  . Lower Back - Pain, Follow-up    Benedetto returns today for reevaluation of right ankle pain right lower leg radiculopathy.  His PT is still pending [facility] approval.  He has been using an ASO brace which does help.  The prednisone Dosepak did help some but this improvement was temporary.  He is still having numbness and tingling in his great toe.  He has pain that radiates from the posterior lateral aspect of the ankle up to the leg.   Review of Systems  Constitutional: Negative.   All other systems reviewed and are negative.    Objective: Vital Signs: There were no vitals taken for this visit.  Physical Exam Vitals signs and nursing note reviewed.  Constitutional:      Appearance: He is well-developed.  Pulmonary:     Effort: Pulmonary effort is normal.  Abdominal:     Palpations: Abdomen is soft.  Skin:    General: Skin is warm.   Neurological:     Mental Status: He is alert and oriented to person, place, and time.  Psychiatric:        Behavior: Behavior normal.        Thought Content: Thought content normal.        Judgment: Judgment normal.     Ortho Exam Right ankle exam significant swelling.  Anterior drawer test is normal.  There is tenderness along the peroneal tendons.  Tendons do not sublux.  Ankle and subtalar motion are painless. Specialty Comments:  No specialty comments available.  Imaging: No results found.   PMFS History: Patient Active Problem List   Diagnosis Date Noted  . Pure hypercholesterolemia 03/25/2019  . Chronic pain of right ankle 03/25/2019  . Eczema 02/20/2019  . Pruritus 09/20/2017  . Candidiasis, intertrigo 07/13/2017  . Need for prophylactic vaccination and inoculation against influenza 04/12/2017  . Pain in joint involving right ankle and foot 04/12/2017  . Acute pain of right shoulder 03/07/2017  . Acute right ankle pain 03/07/2017  . Right foot pain 03/07/2017  . Sprain of shoulder, right 03/07/2017  . Sprain of right ankle 03/07/2017  . Impotence 01/08/2016  . OSA on CPAP 01/08/2016  . Essential hypertension, benign 10/29/2012  Past Medical History:  Diagnosis Date  . Allergy   . Hypertension     History reviewed. No pertinent family history.  Past Surgical History:  Procedure Laterality Date  . KNEE ARTHROSCOPY WITH MENISCAL REPAIR Left 2011   during [place] deployment   Social History   Occupational History  . Occupation: Curator  Tobacco Use  . Smoking status: Never Smoker  . Smokeless tobacco: Never Used  Substance and Sexual Activity  . Alcohol use: Yes    Alcohol/week: 2.0 standard drinks    Types: 2 Standard drinks or equivalent per week    Comment: socially; once a week  . Drug use: No  . Sexual activity: Not on file

## 2019-05-28 NOTE — Addendum Note (Signed)
Addended by: Precious Bard on: 05/28/2019 10:14 AM   Modules accepted: Orders

## 2019-09-20 ENCOUNTER — Ambulatory Visit: Payer: No Typology Code available for payment source | Admitting: Family Medicine

## 2019-09-26 ENCOUNTER — Ambulatory Visit: Payer: No Typology Code available for payment source | Admitting: Family Medicine

## 2020-01-20 ENCOUNTER — Ambulatory Visit: Payer: No Typology Code available for payment source | Admitting: Family Medicine

## 2020-01-20 ENCOUNTER — Encounter: Payer: Self-pay | Admitting: Family Medicine

## 2020-01-20 ENCOUNTER — Ambulatory Visit (INDEPENDENT_AMBULATORY_CARE_PROVIDER_SITE_OTHER): Payer: No Typology Code available for payment source

## 2020-01-20 ENCOUNTER — Other Ambulatory Visit: Payer: Self-pay

## 2020-01-20 VITALS — BP 146/86 | HR 90 | Temp 98.0°F | Ht 67.0 in | Wt 172.0 lb

## 2020-01-20 DIAGNOSIS — M79674 Pain in right toe(s): Secondary | ICD-10-CM

## 2020-01-20 DIAGNOSIS — M5431 Sciatica, right side: Secondary | ICD-10-CM

## 2020-01-20 DIAGNOSIS — G8929 Other chronic pain: Secondary | ICD-10-CM

## 2020-01-20 MED ORDER — GABAPENTIN 300 MG PO CAPS: 300.0000 mg | ORAL_CAPSULE | Freq: Three times a day (TID) | ORAL | 1 refills | Status: AC

## 2020-01-20 MED ORDER — MELOXICAM 15 MG PO TABS: 15.0000 mg | ORAL_TABLET | Freq: Every day | ORAL | 1 refills | Status: AC

## 2020-01-20 NOTE — Patient Instructions (Signed)
° ° ° °  If you have lab work done today you will be contacted with your lab results within the next 2 weeks.  If you have not heard from us then please contact us. The fastest way to get your results is to register for My Chart. ° ° °IF you received an x-ray today, you will receive an invoice from [facility]. Please contact [facility] at [phone] with questions or concerns regarding your invoice.  ° °IF you received labwork today, you will receive an invoice from [facility]. Please contact [facility] at [phone] with questions or concerns regarding your invoice.  ° °Our billing staff will not be able to assist you with questions regarding bills from these companies. ° °You will be contacted with the lab results as soon as they are available. The fastest way to get your results is to activate your My Chart account. Instructions are located on the last page of this paperwork. If you have not heard from us regarding the results in 2 weeks, please contact this office. °  ° ° ° °

## 2020-01-20 NOTE — Progress Notes (Signed)
7/26/20215:04 PM  Isaiah Fowler [birth year], 59 y.o., male [id number]  Chief Complaint  Patient presents with  . R toe numbness    2009 [place] injury , [place] only virtual . difficulty walking   . low back pain R sided leg    2009 injury could not be seen at [facility] / causing difficulty driving     HPI:   Patient is a 59 y.o. male with past medical history significant for HTN and HLP who presents today for right toe numbness  Patient reports that several month ago he dropped a heavy object on right big toe He has pain at MCP joint, swelling, numbness, not able to move it as much He also has pretty regularly right low back pain that radiates down his right leg He denies any changes to bowel or bladder function He tends to trip on his right foot, but not sure if due to numbness or broken ankle that remained stiff and sore  Saw ortho Dr Roda Shutters for right sided sciatica in dec 2020 -  Ordered MRI which did not happen  Back injury originally during active duty Has been trying to be seen at [facility] but still only doing virtual appt Has appt scheduled for nov 2021   Depression screen [facility] 2/9 01/20/2020 03/25/2019 02/20/2019  Decreased Interest 0 0 0  Down, Depressed, Hopeless 0 0 0  PHQ - 2 Score 0 0 0    Fall Risk  01/20/2020 03/25/2019 02/20/2019 04/12/2017 03/07/2017  Falls in the past year? 0 0 0 Yes No  Number falls in past yr: 0 0 - 1 -  Injury with Fall? 0 0 - Yes -  Comment - - - RIGHT SHOUDLER and ANKLE -  Follow up Falls evaluation completed - - - -     No Known Allergies  Prior to Admission medications   Medication Sig Start Date End Date Taking? Authorizing Provider  acetaminophen (TYLENOL) 325 MG tablet  03/01/19  Yes [provider]  atorvastatin (LIPITOR) 80 MG tablet  11/28/18  Yes [provider]  chlorhexidine (HIBICLENS) 4 % external liquid  08/31/18  Yes [provider]  diclofenac (VOLTAREN) 75 MG EC tablet Once finished with steroid taper, can take one pill  bid prn pain 03/29/19  Yes Jari Sportsman L, PA-C  diclofenac sodium (VOLTAREN) 1 % GEL Apply 2 g topically 4 (four) times daily. 03/29/19  Yes Cristie Hem, PA-C  hydrOXYzine (ATARAX/VISTARIL) 25 MG tablet  11/28/18  Yes [provider]  lisinopril-hydrochlorothiazide (ZESTORETIC) 10-12.5 MG tablet Take 1 tablet by mouth daily. 10/19/18  Yes Myles Lipps, MD  Tacrolimus 0.1 % CREA Apply 2 application topically daily. 02/20/19  Yes Janeece Agee, NP  terbinafine (LAMISIL) 250 MG tablet Take 1 tablet (250 mg total) by mouth daily. 02/27/19  Yes Janeece Agee, NP    Past Medical History:  Diagnosis Date  . Allergy   . Hypertension     Past Surgical History:  Procedure Laterality Date  . KNEE ARTHROSCOPY WITH MENISCAL REPAIR Left 2011   during [place] deployment    Social History   Tobacco Use  . Smoking status: Never Smoker  . Smokeless tobacco: Never Used  Substance Use Topics  . Alcohol use: Yes    Alcohol/week: 2.0 standard drinks    Types: 2 Standard drinks or equivalent per week    Comment: socially; once a week    No family history on file.  ROS Per hpi  OBJECTIVE:  Today's  Vitals   01/20/20 1649  BP: (!) 146/86  Pulse: 90  Temp: 98 F (36.7 C)  SpO2: 99%  Weight: 172 lb (78 kg)  Height: 5\' 7"  (1.702 m)   Body mass index is 26.94 kg/m.   Physical Exam Vitals and nursing note reviewed.  Constitutional:      Appearance: He is well-developed.  HENT:     Head: Normocephalic and atraumatic.  Eyes:     Conjunctiva/sclera: Conjunctivae normal.     Pupils: Pupils are equal, round, and reactive to light.  Pulmonary:     Effort: Pulmonary effort is normal.  Musculoskeletal:     Cervical back: Neck supple.     Lumbar back: Tenderness (right paraspinal muscles) and bony tenderness present. Normal range of motion. Positive right straight leg raise test. Negative left straight leg raise test.     Right foot: Decreased range of motion. Bony tenderness  (1st MTP) present. No foot drop. Normal pulse.  Skin:    General: Skin is warm and dry.  Neurological:     Mental Status: He is alert and oriented to person, place, and time.     Motor: No weakness.     Deep Tendon Reflexes: Reflexes are normal and symmetric.       No results found for this or any previous visit (from the past 24 hour(s)).  No results found.   ASSESSMENT and PLAN  1. Chronic toe pain, right foot - DG Foot Complete Right - official reading pending, per my interpretation small avulsion at 1st MTP, placed on hard sole shoe - Ambulatory referral to Podiatry  2. Chronic sciatica, right rx for gabapentin, reviewed r/se/b, [facility]  Other orders - meloxicam (MOBIC) 15 MG tablet; Take 1 tablet (15 mg total) by mouth daily. - gabapentin (NEURONTIN) 300 MG capsule; Take 1 capsule (300 mg total) by mouth 3 (three) times daily.  Return if symptoms worsen or fail to improve.    , MD Primary Care at [facility] [address] [place], [place] Ph.  [phone] Fax [phone]

## 2020-02-07 ENCOUNTER — Encounter: Payer: Self-pay | Admitting: Podiatry

## 2020-02-07 ENCOUNTER — Other Ambulatory Visit: Payer: Self-pay | Admitting: Podiatry

## 2020-02-07 ENCOUNTER — Ambulatory Visit (INDEPENDENT_AMBULATORY_CARE_PROVIDER_SITE_OTHER): Payer: No Typology Code available for payment source

## 2020-02-07 ENCOUNTER — Other Ambulatory Visit: Payer: Self-pay

## 2020-02-07 ENCOUNTER — Ambulatory Visit (INDEPENDENT_AMBULATORY_CARE_PROVIDER_SITE_OTHER): Payer: No Typology Code available for payment source | Admitting: Podiatry

## 2020-02-07 DIAGNOSIS — M79674 Pain in right toe(s): Secondary | ICD-10-CM

## 2020-02-07 DIAGNOSIS — M19071 Primary osteoarthritis, right ankle and foot: Secondary | ICD-10-CM

## 2020-02-07 DIAGNOSIS — Z01818 Encounter for other preprocedural examination: Secondary | ICD-10-CM | POA: Diagnosis not present

## 2020-02-07 NOTE — Progress Notes (Signed)
Subjective:  Patient ID: Isaiah Fowler, male    DOB: 1960/10/24,  MRN: [id number]  Chief Complaint  Patient presents with  . Foot Pain    pt is here for right foot pain. Pain is primarily in the dorsal aspect of the right foot.     59 y.o. male presents with the above complaint.  Patient presents with complaint of right first metatarsophalangeal joint pain.  Patient states been going for quite some time.  Patient states that he has had this going on for a long time since he was in the [employer].  He states he had an injury secondary to dropping something on his foot that led to this injury.  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  He states been on for 10+ years painful to walk on there is throbbing pain associate with it.  Patient has tried relieving with pressure by has not helped much.  He denies any other acute complaints.   Review of Systems: Negative except as noted in the HPI. Denies N/V/F/Ch.  Past Medical History:  Diagnosis Date  . Allergy   . Hypertension     Current Outpatient Medications:  .  acetaminophen (TYLENOL) 325 MG tablet, , Disp: , Rfl:  .  atorvastatin (LIPITOR) 80 MG tablet, , Disp: , Rfl:  .  chlorhexidine (HIBICLENS) 4 % external liquid, , Disp: , Rfl:  .  diclofenac sodium (VOLTAREN) 1 % GEL, Apply 2 g topically 4 (four) times daily., Disp: 150 g, Rfl: 1 .  gabapentin (NEURONTIN) 300 MG capsule, Take 1 capsule (300 mg total) by mouth 3 (three) times daily., Disp: 90 capsule, Rfl: 1 .  hydrOXYzine (ATARAX/VISTARIL) 25 MG tablet, , Disp: , Rfl:  .  lisinopril-hydrochlorothiazide (ZESTORETIC) 10-12.5 MG tablet, Take 1 tablet by mouth daily., Disp: 90 tablet, Rfl: 1 .  meloxicam (MOBIC) 15 MG tablet, Take 1 tablet (15 mg total) by mouth daily., Disp: 30 tablet, Rfl: 1 .  Tacrolimus 0.1 % CREA, Apply 2 application topically daily., Disp: 30 g, Rfl: 1 .  terbinafine  (LAMISIL) 250 MG tablet, Take 1 tablet (250 mg total) by mouth daily., Disp: 14 tablet, Rfl: 0  Social History   Tobacco Use  Smoking Status Never Smoker  Smokeless Tobacco Never Used    No Known Allergies Objective:  There were no vitals filed for this visit. There is no height or weight on file to calculate BMI. Constitutional Well developed. Well nourished.  Vascular Dorsalis pedis pulses palpable bilaterally. Posterior tibial pulses palpable bilaterally. Capillary refill normal to all digits.  No cyanosis or clubbing noted. Pedal hair growth normal.  Neurologic Normal speech. Oriented to person, place, and time. Epicritic sensation to light touch grossly present bilaterally.  Dermatologic Nails well groomed and normal in appearance. No open wounds. No skin lesions.  Orthopedic:  Pain on palpation first metatarsophalangeal joint of the right side.  Intra-articular pain noted with limited range of motion at the first MPJ less than 10 degrees consistent with hallux rigidus.  No extensor or flexor tendinitis noted.  No pain with sesamoidal complex.   Radiographs: 3 views of skeletally mature adult right foot: Severe arthrosis noted with multiple osteophytes to the first metatarsophalangeal joint.  Dorsal flag sign noted.  No other bony abnormalities identified. Assessment:   1. Toe pain, right   2. Osteoarthritis of first metatarsophalangeal (MTP) joint of right foot  3. Preoperative examination    Plan:  Patient was evaluated and treated and all questions answered.  Right first metatarsophalangeal joint degenerative joint disease with arthritis -I explained to the patient the etiology of arthritis and various treatment options were discussed.  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  This is a  wear-and-tear injury I discussed with the patient that unfortunately given the amount of wear and tear there is he may will not benefit from any steroid injection.  Patient also hesitant to take steroid injection.  I believe patient will benefit from a fusion on the right first metatarsophalangeal joint.  I discussed my surgical plan in extensive detail with the patient.  Patient states understanding would like to proceed with surgical fusion.  He has failed all conservative therapy including padding shoe gear modification, and orthotics,, at this time he will benefit from surgical intervention. -My postop protocol include nonweightbearing to the right lower extremity for first 3 weeks followed by partial weightbearing to the heel with a cam boot then regular sneakers.  I discussed this with the patient extensive detail.  Patient states understanding. -Informed surgical risk consent was reviewed and read aloud to the patient.  I reviewed the films.  I have discussed my findings with the patient in great detail.  I have discussed all risks including but not limited to infection, stiffness, scarring, limp, disability, deformity, damage to blood vessels and nerves, numbness, poor healing, need for braces, arthritis, chronic pain, amputation, death.  All benefits and realistic expectations discussed in great detail.  I have made no promises as to the outcome.  I have provided realistic expectations.  I have offered the patient a 2nd opinion, which they have declined and assured me they preferred to proceed despite the risks -A total of 47 minutes was spent in direct patient care as well as pre and post patient encounter activities.  This includes documentation as well as reviewing patient chart for labs, imaging, past medical, surgical, social, and family history as documented in the EMR.  I have reviewed medication allergies as documented in EMR.  I discussed the etiology of condition and treatment options from  conservative to surgical care.  All risks and benefit of the treatment course was discussed in detail.  All questions were answered and return appointment was discussed.  Since the visit completed in an ambulatory/outpatient setting, the patient and/or parent/guardian has been advised to contact the providers office for worsening condition and seek medical treatment and/or call 911 if the patient deems either is necessary.   No follow-ups on file.

## 2020-03-06 ENCOUNTER — Telehealth: Payer: Self-pay

## 2020-03-06 NOTE — Telephone Encounter (Signed)
DOS 03/30/2020  HALLUX MPJ FUSION RT - 28750  [facility]    Notification or Prior Authorization is not required for the requested services  This [employer] plan does not currently require a prior authorization for these services. If you have general questions about the prior authorization requirements, please call us at [phone] or visit [email or web address] > Clinician Resources > Advance and Admission Notification Requirements. The number above acknowledges your notification. Please write this number down for future reference. Notification is not a guarantee of coverage or payment.  Decision ID #:[id number]

## 2020-03-30 DIAGNOSIS — Q76429 Congenital lordosis, unspecified region: Secondary | ICD-10-CM | POA: Diagnosis not present

## 2020-03-30 MED ORDER — IBUPROFEN 800 MG PO TABS
800.0000 mg | ORAL_TABLET | Freq: Four times a day (QID) | ORAL | 1 refills | Status: AC | PRN
Start: 2020-03-30 — End: ?

## 2020-03-30 MED ORDER — OXYCODONE-ACETAMINOPHEN 10-325 MG PO TABS: 1.0000 | ORAL_TABLET | ORAL | 0 refills | Status: AC | PRN

## 2020-03-30 MED ORDER — OXYCODONE-ACETAMINOPHEN 5-325 MG PO TABS: 1.0000 | ORAL_TABLET | Freq: Four times a day (QID) | ORAL | 0 refills | Status: AC | PRN

## 2020-03-30 NOTE — Addendum Note (Signed)
Addended by: Nicholes Rough on: 03/30/2020 07:24 AM   Modules accepted: Orders

## 2020-03-30 NOTE — Addendum Note (Signed)
Addended by: Nicholes Rough on: 03/30/2020 11:27 AM   Modules accepted: Orders

## 2020-04-01 ENCOUNTER — Encounter: Payer: No Typology Code available for payment source | Admitting: Podiatry

## 2020-04-06 ENCOUNTER — Encounter: Payer: No Typology Code available for payment source | Admitting: Podiatry

## 2020-04-08 ENCOUNTER — Ambulatory Visit (INDEPENDENT_AMBULATORY_CARE_PROVIDER_SITE_OTHER): Payer: Self-pay | Admitting: Podiatry

## 2020-04-08 ENCOUNTER — Other Ambulatory Visit: Payer: Self-pay

## 2020-04-08 ENCOUNTER — Encounter: Payer: No Typology Code available for payment source | Admitting: Podiatry

## 2020-04-08 ENCOUNTER — Ambulatory Visit (INDEPENDENT_AMBULATORY_CARE_PROVIDER_SITE_OTHER): Payer: No Typology Code available for payment source

## 2020-04-08 ENCOUNTER — Other Ambulatory Visit: Payer: Self-pay | Admitting: Podiatry

## 2020-04-08 DIAGNOSIS — M19071 Primary osteoarthritis, right ankle and foot: Secondary | ICD-10-CM

## 2020-04-08 DIAGNOSIS — M79676 Pain in unspecified toe(s): Secondary | ICD-10-CM

## 2020-04-08 DIAGNOSIS — M79671 Pain in right foot: Secondary | ICD-10-CM

## 2020-04-08 DIAGNOSIS — Z9889 Other specified postprocedural states: Secondary | ICD-10-CM

## 2020-04-08 DIAGNOSIS — R52 Pain, unspecified: Secondary | ICD-10-CM

## 2020-04-08 MED ORDER — OXYCODONE-ACETAMINOPHEN 10-325 MG PO TABS: 1.0000 | ORAL_TABLET | Freq: Four times a day (QID) | ORAL | 0 refills | Status: AC | PRN

## 2020-04-09 ENCOUNTER — Encounter: Payer: Self-pay | Admitting: Podiatry

## 2020-04-09 NOTE — Progress Notes (Signed)
Subjective:  Patient ID: Isaiah Fowler, male    DOB: [birth year],  MRN: [id number]  Chief Complaint  Patient presents with   Routine Post Op    POV#1 DOS 10.4.2021 HALLUX MPJ FUSION RT. Pt states nausea with pain medication but admits has not been taking with meals. Denies vomiting/chills/fever. Pt is using crutches given by a family member.      59 y.o. male returns for post-op check.  Patient is doing well.  He denies any other acute complaints.  He has been taking his pain medication as needed.  Patient has been using crutches.  I encouraged a knee walker if there is pain with the crutches.  Overall he denies any other acute complaints  Review of Systems: Negative except as noted in the HPI. Denies N/V/F/Ch.  Past Medical History:  Diagnosis Date   Allergy    Hypertension     Current Outpatient Medications:    acetaminophen (TYLENOL) 325 MG tablet, , Disp: , Rfl:    atorvastatin (LIPITOR) 80 MG tablet, , Disp: , Rfl:    chlorhexidine (HIBICLENS) 4 % external liquid, , Disp: , Rfl:    diclofenac sodium (VOLTAREN) 1 % GEL, Apply 2 g topically 4 (four) times daily., Disp: 150 g, Rfl: 1   gabapentin (NEURONTIN) 300 MG capsule, Take 1 capsule (300 mg total) by mouth 3 (three) times daily., Disp: 90 capsule, Rfl: 1   hydrOXYzine (ATARAX/VISTARIL) 25 MG tablet, , Disp: , Rfl:    ibuprofen (ADVIL) 800 MG tablet, Take 1 tablet (800 mg total) by mouth every 6 (six) hours as needed., Disp: 60 tablet, Rfl: 1   lisinopril-hydrochlorothiazide (ZESTORETIC) 10-12.5 MG tablet, Take 1 tablet by mouth daily., Disp: 90 tablet, Rfl: 1   meloxicam (MOBIC) 15 MG tablet, Take 1 tablet (15 mg total) by mouth daily., Disp: 30 tablet, Rfl: 1   oxyCODONE-acetaminophen (PERCOCET) 10-325 MG tablet, Take 1 tablet by mouth every 4 (four) hours as needed for pain., Disp: 30 tablet, Rfl: 0   oxyCODONE-acetaminophen (PERCOCET) 5-325 MG tablet, Take 1-2 tablets by mouth every 6 (six) hours as needed for  severe pain., Disp: 20 tablet, Rfl: 0   Tacrolimus 0.1 % CREA, Apply 2 application topically daily., Disp: 30 g, Rfl: 1   terbinafine (LAMISIL) 250 MG tablet, Take 1 tablet (250 mg total) by mouth daily., Disp: 14 tablet, Rfl: 0   oxyCODONE-acetaminophen (PERCOCET) 10-325 MG tablet, Take 1 tablet by mouth every 6 (six) hours as needed for up to 8 days for pain., Disp: 30 tablet, Rfl: 0  Social History   Tobacco Use  Smoking Status Never Smoker  Smokeless Tobacco Never Used    No Known Allergies Objective:  There were no vitals filed for this visit. There is no height or weight on file to calculate BMI. Constitutional Well developed. Well nourished.  Vascular Foot warm and well perfused. Capillary refill normal to all digits.   Neurologic Normal speech. Oriented to person, place, and time. Epicritic sensation to light touch grossly present bilaterally.  Dermatologic Skin healing well without signs of infection. Skin edges well coapted without signs of infection.  Orthopedic: Tenderness to palpation noted about the surgical site.   Radiographs: 3 views of skeletally mature adult right foot: Hardware is intact no signs of loosening or backing out.  Good bony bridging noted across the fusion site.  No hardware failure noted. Assessment:   1. Status post foot surgery   2. Pain in right foot   3. Osteoarthritis of first  metatarsophalangeal (MTP) joint of right foot    Plan:  Patient was evaluated and treated and all questions answered.  S/p foot surgery right -Progressing as expected post-operatively. -XR: See above -WB Status: Nonweightbearing in right lower extremity with crutches -Sutures: Intact.  No signs of dehiscence noted.  No complications noted. -Medications: None -Foot redressed.  No follow-ups on file.

## 2020-04-15 ENCOUNTER — Encounter: Payer: No Typology Code available for payment source | Admitting: Podiatry

## 2020-04-16 ENCOUNTER — Ambulatory Visit (INDEPENDENT_AMBULATORY_CARE_PROVIDER_SITE_OTHER): Payer: No Typology Code available for payment source | Admitting: Podiatry

## 2020-04-16 ENCOUNTER — Encounter: Payer: Self-pay | Admitting: Podiatry

## 2020-04-16 ENCOUNTER — Other Ambulatory Visit: Payer: Self-pay

## 2020-04-16 DIAGNOSIS — M19071 Primary osteoarthritis, right ankle and foot: Secondary | ICD-10-CM

## 2020-04-16 DIAGNOSIS — Z9889 Other specified postprocedural states: Secondary | ICD-10-CM

## 2020-04-16 NOTE — Progress Notes (Signed)
Subjective:  Patient ID: Isaiah Fowler, male    DOB: 1960-12-14,  MRN: [id number]  Chief Complaint  Patient presents with  . Routine Post Op     POV #2 DOS 03/30/20 HALLUX MPJ FUSION RT     59 y.o. male returns for post-op check.  Patient is doing well.  He denies any other acute complaints.  He has been taking his pain medication as needed.  Patient has obtained and is.   Overall he denies any other acute complaints  Review of Systems: Negative except as noted in the HPI. Denies N/V/F/Ch.  Past Medical History:  Diagnosis Date  . Allergy   . Hypertension     Current Outpatient Medications:  .  acetaminophen (TYLENOL) 325 MG tablet, , Disp: , Rfl:  .  atorvastatin (LIPITOR) 80 MG tablet, , Disp: , Rfl:  .  chlorhexidine (HIBICLENS) 4 % external liquid, , Disp: , Rfl:  .  diclofenac sodium (VOLTAREN) 1 % GEL, Apply 2 g topically 4 (four) times daily., Disp: 150 g, Rfl: 1 .  gabapentin (NEURONTIN) 300 MG capsule, Take 1 capsule (300 mg total) by mouth 3 (three) times daily., Disp: 90 capsule, Rfl: 1 .  hydrOXYzine (ATARAX/VISTARIL) 25 MG tablet, , Disp: , Rfl:  .  ibuprofen (ADVIL) 800 MG tablet, Take 1 tablet (800 mg total) by mouth every 6 (six) hours as needed., Disp: 60 tablet, Rfl: 1 .  lisinopril-hydrochlorothiazide (ZESTORETIC) 10-12.5 MG tablet, Take 1 tablet by mouth daily., Disp: 90 tablet, Rfl: 1 .  meloxicam (MOBIC) 15 MG tablet, Take 1 tablet (15 mg total) by mouth daily., Disp: 30 tablet, Rfl: 1 .  oxyCODONE-acetaminophen (PERCOCET) 10-325 MG tablet, Take 1 tablet by mouth every 4 (four) hours as needed for pain., Disp: 30 tablet, Rfl: 0 .  oxyCODONE-acetaminophen (PERCOCET) 10-325 MG tablet, Take 1 tablet by mouth every 6 (six) hours as needed for up to 8 days for pain., Disp: 30 tablet, Rfl: 0 .  oxyCODONE-acetaminophen (PERCOCET) 5-325 MG tablet, Take 1-2 tablets by mouth every 6 (six) hours as needed for severe pain., Disp: 20 tablet, Rfl: 0 .  Tacrolimus 0.1 % CREA,  Apply 2 application topically daily., Disp: 30 g, Rfl: 1 .  terbinafine (LAMISIL) 250 MG tablet, Take 1 tablet (250 mg total) by mouth daily., Disp: 14 tablet, Rfl: 0  Social History   Tobacco Use  Smoking Status Never Smoker  Smokeless Tobacco Never Used    No Known Allergies Objective:  There were no vitals filed for this visit. There is no height or weight on file to calculate BMI. Constitutional Well developed. Well nourished.  Vascular Foot warm and well perfused. Capillary refill normal to all digits.   Neurologic Normal speech. Oriented to person, place, and time. Epicritic sensation to light touch grossly present bilaterally.  Dermatologic Skin healing well without signs of infection. Skin edges well coapted without signs of infection.  Orthopedic: Tenderness to palpation noted about the surgical site.   Radiographs: 3 views of skeletally mature adult right foot: Hardware is intact no signs of loosening or backing out.  Good bony bridging noted across the fusion site.  No hardware failure noted. Assessment:   1. Status post foot surgery   2. Osteoarthritis of first metatarsophalangeal (MTP) joint of right foot    Plan:  Patient was evaluated and treated and all questions answered.  S/p foot surgery right -Progressing as expected post-operatively. -XR: See above -WB Status: Nonweightbearing in right lower extremity with crutches -Sutures: Were  removed.  No signs of dehiscence noted.  No complication noted. -Medications: None -Foot redressed.  No follow-ups on file.

## 2020-04-22 ENCOUNTER — Encounter: Payer: No Typology Code available for payment source | Admitting: Podiatry

## 2020-04-29 ENCOUNTER — Encounter: Payer: Self-pay | Admitting: Podiatry

## 2020-04-29 ENCOUNTER — Ambulatory Visit (INDEPENDENT_AMBULATORY_CARE_PROVIDER_SITE_OTHER): Payer: No Typology Code available for payment source | Admitting: Podiatry

## 2020-04-29 ENCOUNTER — Other Ambulatory Visit: Payer: Self-pay

## 2020-04-29 ENCOUNTER — Ambulatory Visit (INDEPENDENT_AMBULATORY_CARE_PROVIDER_SITE_OTHER): Payer: No Typology Code available for payment source

## 2020-04-29 ENCOUNTER — Encounter: Payer: No Typology Code available for payment source | Admitting: Podiatry

## 2020-04-29 DIAGNOSIS — Z9889 Other specified postprocedural states: Secondary | ICD-10-CM

## 2020-04-29 DIAGNOSIS — M19071 Primary osteoarthritis, right ankle and foot: Secondary | ICD-10-CM

## 2020-04-29 NOTE — Progress Notes (Signed)
  Subjective:  Patient ID: Isaiah Fowler, male    DOB: December 14, 1960,  MRN: [id number]  Chief Complaint  Patient presents with  . Routine Post Op      (xray)POV #3 DOS 03/30/20 HALLUX MPJ FUSION RT     59 y.o. male returns for post-op check.  Patient is doing well.  He denies any other acute complaints.  He has been taking his ibuprofen as needed.  Patient has obtained and is.   Overall he denies any other acute complaints  Review of Systems: Negative except as noted in the HPI. Denies N/V/F/Ch.  Past Medical History:  Diagnosis Date  . Allergy   . Hypertension     Current Outpatient Medications:  .  acetaminophen (TYLENOL) 325 MG tablet, , Disp: , Rfl:  .  atorvastatin (LIPITOR) 80 MG tablet, , Disp: , Rfl:  .  chlorhexidine (HIBICLENS) 4 % external liquid, , Disp: , Rfl:  .  diclofenac sodium (VOLTAREN) 1 % GEL, Apply 2 g topically 4 (four) times daily., Disp: 150 g, Rfl: 1 .  gabapentin (NEURONTIN) 300 MG capsule, Take 1 capsule (300 mg total) by mouth 3 (three) times daily., Disp: 90 capsule, Rfl: 1 .  hydrOXYzine (ATARAX/VISTARIL) 25 MG tablet, , Disp: , Rfl:  .  ibuprofen (ADVIL) 800 MG tablet, Take 1 tablet (800 mg total) by mouth every 6 (six) hours as needed., Disp: 60 tablet, Rfl: 1 .  lisinopril-hydrochlorothiazide (ZESTORETIC) 10-12.5 MG tablet, Take 1 tablet by mouth daily., Disp: 90 tablet, Rfl: 1 .  meloxicam (MOBIC) 15 MG tablet, Take 1 tablet (15 mg total) by mouth daily., Disp: 30 tablet, Rfl: 1 .  oxyCODONE-acetaminophen (PERCOCET) 10-325 MG tablet, Take 1 tablet by mouth every 4 (four) hours as needed for pain., Disp: 30 tablet, Rfl: 0 .  oxyCODONE-acetaminophen (PERCOCET) 5-325 MG tablet, Take 1-2 tablets by mouth every 6 (six) hours as needed for severe pain., Disp: 20 tablet, Rfl: 0 .  Tacrolimus 0.1 % CREA, Apply 2 application topically daily., Disp: 30 g, Rfl: 1 .  terbinafine (LAMISIL) 250 MG tablet, Take 1 tablet (250 mg total) by mouth daily., Disp: 14 tablet,  Rfl: 0  Social History   Tobacco Use  Smoking Status Never Smoker  Smokeless Tobacco Never Used    No Known Allergies Objective:  There were no vitals filed for this visit. There is no height or weight on file to calculate BMI. Constitutional Well developed. Well nourished.  Vascular Foot warm and well perfused. Capillary refill normal to all digits.   Neurologic Normal speech. Oriented to person, place, and time. Epicritic sensation to light touch grossly present bilaterally.  Dermatologic  skin completely epithelialized.  No signs of infection noted.  Orthopedic:  Mild tenderness to palpation noted about the surgical site.   Radiographs: 3 views of skeletally mature adult right foot: Hardware is intact no signs of loosening or backing out.  Good bony bridging noted across the fusion site.  No hardware failure noted. Assessment:   1. Osteoarthritis of first metatarsophalangeal (MTP) joint of right foot   2. Status post foot surgery    Plan:  Patient was evaluated and treated and all questions answered.  S/p foot surgery right -Progressing as expected post-operatively. -XR: See above -WB Status: Weightbearing as tolerated in cam boot -Sutures: Were removed.  No signs of dehiscence noted.  No complication noted. -Medications: None -Foot redressed.  No follow-ups on file.

## 2020-05-06 ENCOUNTER — Encounter: Payer: Self-pay | Admitting: Family Medicine

## 2020-05-06 ENCOUNTER — Telehealth (INDEPENDENT_AMBULATORY_CARE_PROVIDER_SITE_OTHER): Payer: No Typology Code available for payment source | Admitting: Family Medicine

## 2020-05-06 ENCOUNTER — Other Ambulatory Visit: Payer: Self-pay

## 2020-05-06 DIAGNOSIS — R059 Cough, unspecified: Secondary | ICD-10-CM

## 2020-05-06 DIAGNOSIS — J069 Acute upper respiratory infection, unspecified: Secondary | ICD-10-CM

## 2020-05-06 MED ORDER — MUCINEX MAXIMUM STRENGTH 1200 MG PO TB12: 1.0000 | ORAL_TABLET | Freq: Two times a day (BID) | ORAL | 0 refills | Status: DC | PRN

## 2020-05-06 MED ORDER — BENZONATATE 200 MG PO CAPS: 200.0000 mg | ORAL_CAPSULE | Freq: Two times a day (BID) | ORAL | 0 refills | Status: AC | PRN

## 2020-05-06 MED ORDER — AZITHROMYCIN 250 MG PO TABS: ORAL_TABLET | ORAL | 0 refills | Status: DC

## 2020-05-06 NOTE — Patient Instructions (Signed)
What are the signs or symptoms? Symptoms vary depending on the type of virus and the location of the cells that it invades. Common symptoms of the main types of viral illnesses include: Cold and flu viruses  Fever.  Headache.  Sore throat.  Muscle aches.  Nasal congestion.  Cough. Digestive system (gastrointestinal) viruses  Fever.  Abdominal pain.  Nausea.  Diarrhea.   How is this treated? Viruses can be difficult to treat because they live within cells. Antibiotic medicines do not treat viruses because these drugs do not get inside cells. Treatment for a viral illness may include:  Resting and drinking plenty of fluids.  Medicines to relieve symptoms. These can include over-the-counter medicine for pain and fever, medicines for cough or congestion, and medicines to relieve diarrhea.  Some viral illnesses can be prevented with vaccinations. A common example is the flu shot. Follow these instructions at home: Medicines   Take over-the-counter and prescription medicines only as told by your health care provider.  If you were prescribed an antiviral medicine, take it as told by your health care provider. Do not stop taking the medicine even if you start to feel better.  Be aware of when antibiotics are needed and when they are not needed. Antibiotics do not treat viruses. If your health care provider thinks that you may have a bacterial infection as well as a viral infection, you may get an antibiotic. ? Do not ask for an antibiotic prescription if you have been diagnosed with a viral illness. That will not make your illness go away faster. ? Frequently taking antibiotics when they are not needed can lead to antibiotic resistance. When this develops, the medicine no longer works against the bacteria that it normally fights. General instructions  Drink enough fluids to keep your urine clear or pale yellow.  Rest as much as possible.  Return to your normal activities  as told by your health care provider. Ask your health care provider what activities are safe for you.  Keep all follow-up visits as told by your health care provider. This is important. How is this prevented? Take these actions to reduce your risk of viral infection:  Eat a healthy diet and get enough rest.  Wash your hands often with soap and water. This is especially important when you are in public places. If soap and water are not available, use hand sanitizer.  Avoid close contact with friends and family who have a viral illness.  If you travel to areas where viral gastrointestinal infection is common, avoid drinking water or eating raw food.  Keep your immunizations up to date. Get a flu shot every year as told by your health care provider.  Do not share toothbrushes, nail clippers, razors, or needles with other people.  Always practice safe sex.  Contact a health care provider if:  You have symptoms of a viral illness that do not go away.  Your symptoms come back after going away.  Your symptoms get worse. Get help right away if:  You have trouble breathing.  You have a severe headache or a stiff neck.  You have severe vomiting or abdominal pain. This information is not intended to replace advice given to you by your health care provider. Make sure you discuss any questions you have with your health care provider. Document Revised: 05/26/2017 Document Reviewed: 10/23/2015 Elsevier Patient Education  2020 [employer] Cough, Adult A cough helps to clear your throat and lungs. A cough may be a  sign of an illness or another medical condition. An acute cough may only last 2-3 weeks, while a chronic cough may last 8 or more weeks. Many things can cause a cough. They include:  Germs (viruses or bacteria) that attack the airway.  Breathing in things that bother (irritate) your lungs.  Allergies.  Asthma.  Mucus that runs down the back of your throat (postnasal  drip).  Smoking.  Acid backing up from the stomach into the tube that moves food from the mouth to the stomach (gastroesophageal reflux).  Some medicines.  Lung problems.  Other medical conditions, such as heart failure or a blood clot in the lung (pulmonary embolism). Follow these instructions at home: Medicines  Take over-the-counter and prescription medicines only as told by your doctor.  Talk with your doctor before you take medicines that stop a cough (coughsuppressants). Lifestyle   Do not smoke, and try not to be around smoke. Do not use any products that contain nicotine or tobacco, such as cigarettes, e-cigarettes, and chewing tobacco. If you need help quitting, ask your doctor.  Drink enough fluid to keep your pee (urine) pale yellow.  Avoid caffeine.  Do not drink alcohol if your doctor tells you not to drink. General instructions   Watch for any changes in your cough. Tell your doctor about them.  Always cover your mouth when you cough.  Stay away from things that make you cough, such as perfume, candles, campfire smoke, or cleaning products.  If the air is dry, use a cool mist vaporizer or humidifier in your home.  If your cough is worse at night, try using extra pillows to raise your head up higher while you sleep.  Rest as needed.  Keep all follow-up visits as told by your doctor. This is important. Contact a doctor if:  You have new symptoms.  You cough up pus.  Your cough does not get better after 2-3 weeks, or your cough gets worse.  Cough medicine does not help your cough and you are not sleeping well.  You have pain that gets worse or pain that is not helped with medicine.  You have a fever.  You are losing weight and you do not know why.  You have night sweats. Get help right away if:  You cough up blood.  You have trouble breathing.  Your heartbeat is very fast. These symptoms may be an emergency. Do not wait to see if the  symptoms will go away. Get medical help right away. Call your local emergency services (911 in the [place] Do not drive yourself to the hospital. Summary  A cough helps to clear your throat and lungs. Many things can cause a cough.  Take over-the-counter and prescription medicines only as told by your doctor.  Always cover your mouth when you cough.  Contact a doctor if you have new symptoms or you have a cough that does not get better or gets worse. This information is not intended to replace advice given to you by your health care provider. Make sure you discuss any questions you have with your health care provider. Document Revised: 07/02/2018 Document Reviewed: 07/02/2018 Elsevier Patient Education  2020 [employer].

## 2020-05-06 NOTE — Progress Notes (Signed)
Virtual Visit Note  I connected with patient on 05/06/20 at 1610 by Epic video  and verified that I am speaking with the correct person using two identifiers. Isaiah Fowler is currently located at home and no family members are currently with them during visit. The provider, Azalee Course Rennae Ferraiolo, FNP is located in their office at time of visit.  I discussed the limitations, risks, security and privacy concerns of performing an evaluation and management service by telephone and the availability of in person appointments. I also discussed with the patient that there may be a patient responsible charge related to this service. The patient expressed understanding and agreed to proceed.   I provided 20 minutes of non-face-to-face time during this encounter.  Chief Complaint  Patient presents with  . Cough    runny nose, mucinex, nyquil, robitussin - none helped x 2 weeks/ OCT 4 FOOT SURG      HPI ? Cough and runny nose for 2 weeks Robitussin, tylenol, dayquil, nyquil, nasal spray Vicks, ibuprofen, tried mucinex Seems to be about the same Worse at night Lots of coughing Yes phlegm clear mucus Yes sneezing Denies fever, chills, aches Covid done, hasnt gotten flu shot yet No sick contacts Yes headaches and sinus pressure Ibuprofen for headaches Rarely ever sick Denies wheezing Denies facial pressure   Cough and congestion  mucinex Tessalon pearles Denies allergies   Allergies  Allergen Reactions  . Other     Prior to Admission medications   Medication Sig Start Date End Date Taking? Authorizing Provider  acetaminophen (TYLENOL) 325 MG tablet  03/01/19  Yes [provider]  atorvastatin (LIPITOR) 80 MG tablet  11/28/18  Yes [provider]  chlorhexidine (HIBICLENS) 4 % external liquid  08/31/18  Yes [provider]  diclofenac sodium (VOLTAREN) 1 % GEL Apply 2 g topically 4 (four) times daily. 03/29/19  Yes Cristie Hem, PA-C  gabapentin (NEURONTIN) 300  MG capsule Take 1 capsule (300 mg total) by mouth 3 (three) times daily. 01/20/20  Yes Myles Lipps, MD  hydrOXYzine (ATARAX/VISTARIL) 25 MG tablet  11/28/18  Yes [provider]  ibuprofen (ADVIL) 800 MG tablet Take 1 tablet (800 mg total) by mouth every 6 (six) hours as needed. 03/30/20  Yes Candelaria Stagers, DPM  lisinopril-hydrochlorothiazide (ZESTORETIC) 10-12.5 MG tablet Take 1 tablet by mouth daily. 10/19/18  Yes Myles Lipps, MD  meloxicam (MOBIC) 15 MG tablet Take 1 tablet (15 mg total) by mouth daily. 01/20/20  Yes Myles Lipps, MD  oxyCODONE-acetaminophen (PERCOCET) 10-325 MG tablet Take 1 tablet by mouth every 4 (four) hours as needed for pain. 03/30/20  Yes Candelaria Stagers, DPM  oxyCODONE-acetaminophen (PERCOCET) 5-325 MG tablet Take 1-2 tablets by mouth every 6 (six) hours as needed for severe pain. 03/30/20  Yes Candelaria Stagers, DPM  Tacrolimus 0.1 % CREA Apply 2 application topically daily. 02/20/19  Yes Janeece Agee, NP  terbinafine (LAMISIL) 250 MG tablet Take 1 tablet (250 mg total) by mouth daily. 02/27/19  Yes Janeece Agee, NP    Past Medical History:  Diagnosis Date  . Allergy   . Hypertension     Past Surgical History:  Procedure Laterality Date  . DG GREAT TOE RIGHT FOOT Right    FOOT AND ANKLE N [address] ST / PLATES, SCREWS  . KNEE ARTHROSCOPY WITH MENISCAL REPAIR Left 2011   during [place] deployment    Social History   Tobacco Use  . Smoking status: Never Smoker  .  Smokeless tobacco: Never Used  Substance Use Topics  . Alcohol use: Yes    Alcohol/week: 2.0 standard drinks    Types: 2 Standard drinks or equivalent per week    Comment: socially; once a week    History reviewed. No pertinent family history.  Review of Systems  Constitutional: Negative for chills, fever and malaise/fatigue.  HENT: Positive for congestion and sinus pain. Negative for ear discharge, ear pain and sore throat.   Respiratory: Positive for cough and sputum production.  Negative for shortness of breath and wheezing.   Cardiovascular: Negative for chest pain and palpitations.  Skin: Negative for rash.  Neurological: Positive for headaches.    Objective  Constitutional:      General: She is not in acute distress.    Appearance: Normal appearance. She is not ill-appearing.   Pulmonary:     Effort: Pulmonary effort is normal. No respiratory distress.  Neurological:     Mental Status: She is alert and oriented to person, place, and time.  Psychiatric:        Mood and Affect: Mood normal.        Behavior: Behavior normal.    ASSESSMENT and PLAN  Problem List Items Addressed This Visit    None    Visit Diagnoses    Acute upper respiratory infection    -  Primary   Relevant Medications   benzonatate (TESSALON) 200 MG capsule   Guaifenesin (MUCINEX MAXIMUM STRENGTH) 1200 MG TB12   azithromycin (ZITHROMAX) 250 MG tablet   Cough       Relevant Medications   benzonatate (TESSALON) 200 MG capsule   Guaifenesin (MUCINEX MAXIMUM STRENGTH) 1200 MG TB12    RTC instructions provided Take antibiotics as prescribed Treat symptoms cough with tessalon caps and congestion with mucinex Increase rest and fluids.  Return in about 2 weeks (around 05/20/2020), or if symptoms worsen or fail to improve.   The above assessment and management plan was discussed with the patient. The patient verbalized understanding of and has agreed to the management plan. Patient is aware to call the clinic if symptoms persist or worsen. Patient is aware when to return to the clinic for a follow-up visit. Patient educated on when it is appropriate to go to the emergency department.     Macario Carls Ritisha Deitrick, FNP-BC Primary Care at [facility] [address] [place], [place] [address] Ph.  [phone] Fax [phone]

## 2020-05-27 ENCOUNTER — Ambulatory Visit (INDEPENDENT_AMBULATORY_CARE_PROVIDER_SITE_OTHER): Payer: No Typology Code available for payment source | Admitting: Podiatry

## 2020-05-27 ENCOUNTER — Other Ambulatory Visit: Payer: Self-pay

## 2020-05-27 ENCOUNTER — Ambulatory Visit (INDEPENDENT_AMBULATORY_CARE_PROVIDER_SITE_OTHER): Payer: No Typology Code available for payment source

## 2020-05-27 DIAGNOSIS — M19071 Primary osteoarthritis, right ankle and foot: Secondary | ICD-10-CM | POA: Diagnosis not present

## 2020-05-27 DIAGNOSIS — Q666 Other congenital valgus deformities of feet: Secondary | ICD-10-CM | POA: Diagnosis not present

## 2020-05-27 DIAGNOSIS — Z9889 Other specified postprocedural states: Secondary | ICD-10-CM

## 2020-05-28 ENCOUNTER — Encounter: Payer: Self-pay | Admitting: Podiatry

## 2020-05-28 NOTE — Progress Notes (Signed)
Subjective:  Patient ID: Isaiah Fowler, male    DOB: February 12, 1961,  MRN: [id number]  Chief Complaint  Patient presents with  . Routine Post Op    POV #4 DOS 03/30/20 HALLUX MPJ FUSION RT     59 y.o. male returns for post-op check.  Patient is doing well.  He denies any other acute complaints.  He has been taking his ibuprofen as needed.  He states that he is not quite able to put his full weight down as he still has some submetatarsal 1 pain.  He would like to know if there is anything else that can be done for this.  Review of Systems: Negative except as noted in the HPI. Denies N/V/F/Ch.  Past Medical History:  Diagnosis Date  . Allergy   . Hypertension     Current Outpatient Medications:  .  acetaminophen (TYLENOL) 325 MG tablet, , Disp: , Rfl:  .  atorvastatin (LIPITOR) 80 MG tablet, , Disp: , Rfl:  .  azithromycin (ZITHROMAX) 250 MG tablet, 2 tabs first day and 1 tab each day after for 4 days, Disp: 6 tablet, Rfl: 0 .  chlorhexidine (HIBICLENS) 4 % external liquid, , Disp: , Rfl:  .  diclofenac sodium (VOLTAREN) 1 % GEL, Apply 2 g topically 4 (four) times daily., Disp: 150 g, Rfl: 1 .  gabapentin (NEURONTIN) 300 MG capsule, Take 1 capsule (300 mg total) by mouth 3 (three) times daily., Disp: 90 capsule, Rfl: 1 .  Guaifenesin (MUCINEX MAXIMUM STRENGTH) 1200 MG TB12, Take 1 tablet (1,200 mg total) by mouth every 12 (twelve) hours as needed (congestion)., Disp: 14 tablet, Rfl: 0 .  hydrOXYzine (ATARAX/VISTARIL) 25 MG tablet, , Disp: , Rfl:  .  ibuprofen (ADVIL) 800 MG tablet, Take 1 tablet (800 mg total) by mouth every 6 (six) hours as needed., Disp: 60 tablet, Rfl: 1 .  lisinopril-hydrochlorothiazide (ZESTORETIC) 10-12.5 MG tablet, Take 1 tablet by mouth daily., Disp: 90 tablet, Rfl: 1 .  meloxicam (MOBIC) 15 MG tablet, Take 1 tablet (15 mg total) by mouth daily., Disp: 30 tablet, Rfl: 1 .  oxyCODONE-acetaminophen (PERCOCET) 10-325 MG tablet, Take 1 tablet by mouth every 4 (four) hours  as needed for pain., Disp: 30 tablet, Rfl: 0 .  oxyCODONE-acetaminophen (PERCOCET) 5-325 MG tablet, Take 1-2 tablets by mouth every 6 (six) hours as needed for severe pain., Disp: 20 tablet, Rfl: 0 .  Tacrolimus 0.1 % CREA, Apply 2 application topically daily., Disp: 30 g, Rfl: 1 .  terbinafine (LAMISIL) 250 MG tablet, Take 1 tablet (250 mg total) by mouth daily., Disp: 14 tablet, Rfl: 0  Social History   Tobacco Use  Smoking Status Never Smoker  Smokeless Tobacco Never Used    Allergies  Allergen Reactions  . Other    Objective:  There were no vitals filed for this visit. There is no height or weight on file to calculate BMI. Constitutional Well developed. Well nourished.  Vascular Foot warm and well perfused. Capillary refill normal to all digits.   Neurologic Normal speech. Oriented to person, place, and time. Epicritic sensation to light touch grossly present bilaterally.  Dermatologic  skin completely epithelialized.  No signs of infection noted.  Orthopedic:  Mild tenderness to palpation noted about the surgical site.   Radiographs: 3 views of skeletally mature adult right foot: Hardware is intact no signs of loosening or backing out.  Good bony bridging noted across the fusion site.  No hardware failure noted. Assessment:   1. Osteoarthritis of  first metatarsophalangeal (MTP) joint of right foot   2. Status post foot surgery    Plan:  Patient was evaluated and treated and all questions answered.  S/p foot surgery right -Progressing as expected post-operatively. -XR: See above -WB Status: Weightbearing as tolerated in surgical shoe -Sutures: None -Medications: None -Foot redressed.  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  Given the patient has severe arthritis with underlying hallux rigidus I believe patient will benefit from custom-made orthotics with  Morton's extension to bilateral feet.  He will be scheduled see rec for custom-made orthotics with Morton's extension bilaterally No follow-ups on file.

## 2020-06-01 ENCOUNTER — Ambulatory Visit (INDEPENDENT_AMBULATORY_CARE_PROVIDER_SITE_OTHER): Payer: No Typology Code available for payment source | Admitting: Orthotics

## 2020-06-01 ENCOUNTER — Other Ambulatory Visit: Payer: Self-pay

## 2020-06-01 DIAGNOSIS — Z9889 Other specified postprocedural states: Secondary | ICD-10-CM | POA: Diagnosis not present

## 2020-06-01 DIAGNOSIS — Q666 Other congenital valgus deformities of feet: Secondary | ICD-10-CM | POA: Diagnosis not present

## 2020-06-01 DIAGNOSIS — M19071 Primary osteoarthritis, right ankle and foot: Secondary | ICD-10-CM

## 2020-06-01 NOTE — Progress Notes (Signed)
Cast today for CMFO to address b/l hallux rigidus; plan on device to hug arch and rigid mortoon extention.

## 2020-06-30 ENCOUNTER — Other Ambulatory Visit: Payer: Self-pay

## 2020-06-30 ENCOUNTER — Ambulatory Visit: Payer: No Typology Code available for payment source | Admitting: Orthotics

## 2020-06-30 DIAGNOSIS — Q666 Other congenital valgus deformities of feet: Secondary | ICD-10-CM

## 2020-06-30 DIAGNOSIS — Z9889 Other specified postprocedural states: Secondary | ICD-10-CM

## 2020-06-30 NOTE — Progress Notes (Signed)
Patient picked up f/o and was pleased with fit, comfort, and function.  Worked well with footwear.  Told of rbeak in period and how to report any issues.  

## 2020-07-10 ENCOUNTER — Encounter: Payer: Self-pay | Admitting: Podiatry

## 2020-07-10 ENCOUNTER — Other Ambulatory Visit: Payer: Self-pay

## 2020-07-10 ENCOUNTER — Ambulatory Visit (INDEPENDENT_AMBULATORY_CARE_PROVIDER_SITE_OTHER): Payer: No Typology Code available for payment source

## 2020-07-10 ENCOUNTER — Ambulatory Visit (INDEPENDENT_AMBULATORY_CARE_PROVIDER_SITE_OTHER): Payer: No Typology Code available for payment source | Admitting: Podiatry

## 2020-07-10 DIAGNOSIS — Z9889 Other specified postprocedural states: Secondary | ICD-10-CM

## 2020-07-10 DIAGNOSIS — M19071 Primary osteoarthritis, right ankle and foot: Secondary | ICD-10-CM

## 2020-07-10 DIAGNOSIS — M79671 Pain in right foot: Secondary | ICD-10-CM

## 2020-07-10 NOTE — Progress Notes (Signed)
Subjective:  Patient ID: Isaiah Fowler, male    DOB: 1960/09/23,  MRN: [id number]  Chief Complaint  Patient presents with  . Routine Post Op    Post op. DOS 03/30/2020     60 y.o. male returns for post-op check.  Patient is doing well.  He denies any other acute complaints.  He has been able to ambulate without any acute complaints.  He is able to wait is full weightbearing in regular shoes.  He denies any other acute issues  Review of Systems: Negative except as noted in the HPI. Denies N/V/F/Ch.  Past Medical History:  Diagnosis Date  . Allergy   . Hypertension     Current Outpatient Medications:  .  acetaminophen (TYLENOL) 325 MG tablet, , Disp: , Rfl:  .  atorvastatin (LIPITOR) 80 MG tablet, , Disp: , Rfl:  .  azithromycin (ZITHROMAX) 250 MG tablet, 2 tabs first day and 1 tab each day after for 4 days, Disp: 6 tablet, Rfl: 0 .  chlorhexidine (HIBICLENS) 4 % external liquid, , Disp: , Rfl:  .  diclofenac sodium (VOLTAREN) 1 % GEL, Apply 2 g topically 4 (four) times daily., Disp: 150 g, Rfl: 1 .  gabapentin (NEURONTIN) 300 MG capsule, Take 1 capsule (300 mg total) by mouth 3 (three) times daily., Disp: 90 capsule, Rfl: 1 .  Guaifenesin (MUCINEX MAXIMUM STRENGTH) 1200 MG TB12, Take 1 tablet (1,200 mg total) by mouth every 12 (twelve) hours as needed (congestion)., Disp: 14 tablet, Rfl: 0 .  hydrOXYzine (ATARAX/VISTARIL) 25 MG tablet, , Disp: , Rfl:  .  ibuprofen (ADVIL) 800 MG tablet, Take 1 tablet (800 mg total) by mouth every 6 (six) hours as needed., Disp: 60 tablet, Rfl: 1 .  lisinopril-hydrochlorothiazide (ZESTORETIC) 10-12.5 MG tablet, Take 1 tablet by mouth daily., Disp: 90 tablet, Rfl: 1 .  meloxicam (MOBIC) 15 MG tablet, Take 1 tablet (15 mg total) by mouth daily., Disp: 30 tablet, Rfl: 1 .  oxyCODONE-acetaminophen (PERCOCET) 10-325 MG tablet, Take 1 tablet by mouth every 4 (four) hours as needed for pain., Disp: 30 tablet, Rfl: 0 .  oxyCODONE-acetaminophen (PERCOCET) 5-325  MG tablet, Take 1-2 tablets by mouth every 6 (six) hours as needed for severe pain., Disp: 20 tablet, Rfl: 0 .  Tacrolimus 0.1 % CREA, Apply 2 application topically daily., Disp: 30 g, Rfl: 1 .  terbinafine (LAMISIL) 250 MG tablet, Take 1 tablet (250 mg total) by mouth daily., Disp: 14 tablet, Rfl: 0  Social History   Tobacco Use  Smoking Status Never Smoker  Smokeless Tobacco Never Used    Allergies  Allergen Reactions  . Other    Objective:  There were no vitals filed for this visit. There is no height or weight on file to calculate BMI. Constitutional Well developed. Well nourished.  Vascular Foot warm and well perfused. Capillary refill normal to all digits.   Neurologic Normal speech. Oriented to person, place, and time. Epicritic sensation to light touch grossly present bilaterally.  Dermatologic  skin completely epithelialized.  No signs of infection noted.  Rigidity noted across the first metatarsophalangeal joint.  Good fusion noted.  Orthopedic:  Now tenderness to palpation noted about the surgical site.   Radiographs: 3 views of skeletally mature adult right foot: Hardware is intact no signs of loosening or backing out.  Good bony bridging noted across the fusion site.  No hardware failure noted. Assessment:   1. Status post foot surgery   2. Osteoarthritis of first metatarsophalangeal (MTP) joint  of right foot    Plan:  Patient was evaluated and treated and all questions answered.  S/p foot surgery right -Progressing as expected post-operatively. -XR: See above -WB Status: Weightbearing as tolerated in surgical shoe -Sutures: None -Medications: None -Clinically healed.  At this time I instructed patient he can return to work without any reservation.  I instructed him to start of the light duty and then gradually work up to heavy duty.  Patient states understanding if any foot and ankle issues arise in the future he will come back and see me.  Pes planovalgus  with underlying first MPJ arthritis -I explained to the patient the etiology of pes planovalgus versus treatment options were discussed.  Given the patient has severe arthritis with underlying hallux rigidus I believe patient will benefit from custom-made orthotics with Morton's extension to bilateral feet.   -Patient has obtained orthotics and is functioning well without any acute complaints. No follow-ups on file.

## 2020-07-23 ENCOUNTER — Ambulatory Visit: Payer: No Typology Code available for payment source | Admitting: Family Medicine

## 2020-07-23 ENCOUNTER — Encounter: Payer: Self-pay | Admitting: Family Medicine

## 2020-07-23 ENCOUNTER — Other Ambulatory Visit: Payer: Self-pay

## 2020-07-23 ENCOUNTER — Ambulatory Visit (INDEPENDENT_AMBULATORY_CARE_PROVIDER_SITE_OTHER): Payer: No Typology Code available for payment source

## 2020-07-23 VITALS — BP 152/90 | HR 78 | Temp 98.3°F | Ht 67.0 in | Wt 174.0 lb

## 2020-07-23 DIAGNOSIS — G8911 Acute pain due to trauma: Secondary | ICD-10-CM

## 2020-07-23 DIAGNOSIS — M25512 Pain in left shoulder: Secondary | ICD-10-CM | POA: Diagnosis not present

## 2020-07-23 DIAGNOSIS — I10 Essential (primary) hypertension: Secondary | ICD-10-CM

## 2020-07-23 MED ORDER — PREDNISONE 20 MG PO TABS: ORAL_TABLET | ORAL | 0 refills | Status: AC

## 2020-07-23 MED ORDER — LISINOPRIL-HYDROCHLOROTHIAZIDE 10-12.5 MG PO TABS: 2.0000 | ORAL_TABLET | Freq: Every day | ORAL | 1 refills | Status: AC

## 2020-07-23 MED ORDER — CYCLOBENZAPRINE HCL 10 MG PO TABS: 10.0000 mg | ORAL_TABLET | Freq: Three times a day (TID) | ORAL | 1 refills | Status: AC | PRN

## 2020-07-23 NOTE — Progress Notes (Signed)
1/27/20224:08 PM  Isaiah Fowler Jan 15, 1961, 60 y.o., male [id number]  Chief Complaint  Patient presents with  . L shoulder pain     Fell approx 2 weeks ago when released back to work for foot surgery     HPI:   Patient is a 60 y.o. male with past medical history significant for HTN, OSA who presents today for Left shoulder pain.  Seen by podiatry 1/14 Cleared to go back to work starting with light duty post surgery [place] Seat on the [place] on [address] Denies loc or hit head Heard a snap at that time Hurts to lean on it Elevates arm on pillows Using Icy hot, head and ice for pain Taking Ibuprofen 800mg , Percocet, Meloxicam, for pain Helps for awhile but hurts again Can't sleep at night Has tried shoulder exercises    HTN BP Readings from Last 3 Encounters:  07/23/20 (!) 152/90  01/20/20 (!) 146/86  03/25/19 140/82   Takes BP at home:  Lisinopril/ HCTZ 10/12.5   Depression screen [facility] 2/9 07/23/2020 01/20/2020 03/25/2019  Decreased Interest 0 0 0  Down, Depressed, Hopeless 0 0 0  PHQ - 2 Score 0 0 0    Fall Risk  07/23/2020 01/20/2020 03/25/2019 02/20/2019 04/12/2017  Falls in the past year? 1 0 0 0 Yes  Number falls in past yr: 0 0 0 - 1  Injury with Fall? 0 0 0 - Yes  Comment - - - - RIGHT SHOUDLER and ANKLE  Follow up Falls evaluation completed Falls evaluation completed - - -     Allergies  Allergen Reactions  . Other     Prior to Admission medications   Medication Sig Start Date End Date Taking? Authorizing Provider  acetaminophen (TYLENOL) 325 MG tablet  03/01/19  Yes [provider]  atorvastatin (LIPITOR) 80 MG tablet  11/28/18  Yes [provider]  chlorhexidine (HIBICLENS) 4 % external liquid  08/31/18  Yes [provider]  diclofenac sodium (VOLTAREN) 1 % GEL Apply 2 g topically 4 (four) times daily. 03/29/19  Yes 05/29/19, PA-C  gabapentin (NEURONTIN) 300 MG capsule Take 1 capsule (300 mg total) by mouth 3 (three) times daily. 01/20/20   Yes 01/22/20, Lezlie Lye, MD  hydrOXYzine (ATARAX/VISTARIL) 25 MG tablet  11/28/18  Yes [provider]  ibuprofen (ADVIL) 800 MG tablet Take 1 tablet (800 mg total) by mouth every 6 (six) hours as needed. 03/30/20  Yes 05/30/20, DPM  lisinopril-hydrochlorothiazide (ZESTORETIC) 10-12.5 MG tablet Take 1 tablet by mouth daily. 10/19/18  Yes 10/21/18, Lezlie Lye, MD  meloxicam (MOBIC) 15 MG tablet Take 1 tablet (15 mg total) by mouth daily. 01/20/20  Yes 01/22/20, Lezlie Lye, MD  oxyCODONE-acetaminophen (PERCOCET) 10-325 MG tablet Take 1 tablet by mouth every 4 (four) hours as needed for pain. 03/30/20  Yes 05/30/20, DPM  oxyCODONE-acetaminophen (PERCOCET) 5-325 MG tablet Take 1-2 tablets by mouth every 6 (six) hours as needed for severe pain. 03/30/20  Yes 05/30/20, DPM  Tacrolimus 0.1 % CREA Apply 2 application topically daily. 02/20/19  Yes 02/22/19, NP  terbinafine (LAMISIL) 250 MG tablet Take 1 tablet (250 mg total) by mouth daily. 02/27/19  Yes 04/29/19, NP    Past Medical History:  Diagnosis Date  . Allergy   . Hypertension     Past Surgical History:  Procedure Laterality Date  . DG GREAT TOE RIGHT FOOT Right    FOOT AND ANKLE N CHURCH  ST / PLATES, SCREWS  . KNEE ARTHROSCOPY WITH MENISCAL REPAIR Left 2011   during [place] deployment    Social History   Tobacco Use  . Smoking status: Never Smoker  . Smokeless tobacco: Never Used  Substance Use Topics  . Alcohol use: Yes    Alcohol/week: 2.0 standard drinks    Types: 2 Standard drinks or equivalent per week    Comment: socially; once a week    History reviewed. No pertinent family history.  Review of Systems  Eyes: Negative for blurred vision and double vision.  Respiratory: Negative for cough and shortness of breath.   Cardiovascular: Negative for chest pain and palpitations.  Musculoskeletal: Positive for falls and joint pain. Negative for back pain, myalgias and neck pain.  Neurological:  Negative for dizziness, tingling, sensory change, focal weakness, weakness and headaches.     OBJECTIVE:  Today's Vitals   07/23/20 1452  BP: (!) 152/90  Pulse: 78  Temp: 98.3 F (36.8 C)  SpO2: 99%  Weight: 174 lb (78.9 kg)  Height: 5\' 7"  (1.702 m)   Body mass index is 27.25 kg/m.   Physical Exam Vitals reviewed.  Constitutional:      Appearance: Normal appearance.  HENT:     Head: Normocephalic and atraumatic.  Eyes:     Conjunctiva/sclera: Conjunctivae normal.     Pupils: Pupils are equal, round, and reactive to light.  Cardiovascular:     Rate and Rhythm: Normal rate and regular rhythm.     Pulses: Normal pulses.     Heart sounds: Normal heart sounds. No murmur heard. No friction rub. No gallop.   Pulmonary:     Effort: Pulmonary effort is normal. No respiratory distress.     Breath sounds: Normal breath sounds. No stridor. No wheezing or rales.  Abdominal:     General: Bowel sounds are normal.     Palpations: Abdomen is soft.     Tenderness: There is no abdominal tenderness.  Musculoskeletal:     Right lower leg: No edema.     Left lower leg: No edema.  Skin:    General: Skin is warm and dry.  Neurological:     General: No focal deficit present.     Mental Status: He is alert and oriented to person, place, and time.  Psychiatric:        Mood and Affect: Mood normal.        Behavior: Behavior normal.   Right shoulder: normal Left Shoulder:  Anterior 90 degrees Cant move posterior Abduct 45 degrees max Pulses and strength intact Shoulder blade and anterior shoulder pain to palpation  No results found for this or any previous visit (from the past 24 hour(s)).  DG Shoulder Left  Result Date: 07/23/2020 CLINICAL DATA:  60 year old male with left shoulder pain. EXAM: LEFT SHOULDER - 2+ VIEW COMPARISON:  None. FINDINGS: There is no evidence of fracture or dislocation. There is no evidence of arthropathy or other focal bone abnormality. Soft tissues are  unremarkable. IMPRESSION: Negative. Electronically Signed   By: 77 M.D.   On: 07/23/2020 15:32     ASSESSMENT and PLAN  Problem List Items Addressed This Visit   None   Visit Diagnoses    Acute pain of left shoulder due to trauma    -  Primary   Relevant Medications   cyclobenzaprine (FLEXERIL) 10 MG tablet   predniSONE (DELTASONE) 20 MG tablet   Other Relevant Orders   DG Shoulder Left (Completed)  AMB referral to orthopedics   Essential hypertension       Relevant Medications   lisinopril-hydrochlorothiazide (ZESTORETIC) 10-12.5 MG tablet      Plan . Continue home Pain medications in addition to flexeril at night and steroid taper . Shoulder exercises provided . Follow up with ortho if no improvement . Increased Zetoretic to 20/25mg , follow up with BP   Return in about 2 months (around 09/20/2020).    Macario Carls Sachin Ferencz, FNP-BC Primary Care at [facility] [address] [place], [place] [address] Ph.  [phone] Fax [phone]

## 2020-07-23 NOTE — Patient Instructions (Addendum)
Shoulder Exercises Ask your health care provider which exercises are safe for you. Do exercises exactly as told by your health care provider and adjust them as directed. It is normal to feel mild stretching, pulling, tightness, or discomfort as you do these exercises. Stop right away if you feel sudden pain or your pain gets worse. Do not begin these exercises until told by your health care provider. Stretching exercises External rotation and abduction This exercise is sometimes called corner stretch. This exercise rotates your arm outward (external rotation) and moves your arm out from your body (abduction). 1. Stand in a doorway with one of your feet slightly in front of the other. This is called a staggered stance. If you cannot reach your forearms to the door frame, stand facing a corner of a room. 2. Choose one of the following positions as told by your health care provider: ? Place your hands and forearms on the door frame above your head. ? Place your hands and forearms on the door frame at the height of your head. ? Place your hands on the door frame at the height of your elbows. 3. Slowly move your weight onto your front foot until you feel a stretch across your chest and in the front of your shoulders. Keep your head and chest upright and keep your abdominal muscles tight. 4. Hold for __________ seconds. 5. To release the stretch, shift your weight to your back foot. Repeat __________ times. Complete this exercise __________ times a day.   Extension, standing 1. Stand and hold a broomstick, a cane, or a similar object behind your back. ? Your hands should be a little wider than shoulder width apart. ? Your palms should face away from your back. 2. Keeping your elbows straight and your shoulder muscles relaxed, move the stick away from your body until you feel a stretch in your shoulders (extension). ? Avoid shrugging your shoulders while you move the stick. Keep your shoulder blades  tucked down toward the middle of your back. 3. Hold for __________ seconds. 4. Slowly return to the starting position. Repeat __________ times. Complete this exercise __________ times a day. Range-of-motion exercises Pendulum 1. Stand near a wall or a surface that you can hold onto for balance. 2. Bend at the waist and let your left / right arm hang straight down. Use your other arm to support you. Keep your back straight and do not lock your knees. 3. Relax your left / right arm and shoulder muscles, and move your hips and your trunk so your left / right arm swings freely. Your arm should swing because of the motion of your body, not because you are using your arm or shoulder muscles. 4. Keep moving your hips and trunk so your arm swings in the following directions, as told by your health care provider: ? Side to side. ? Forward and backward. ? In clockwise and counterclockwise circles. 5. Continue each motion for __________ seconds, or for as long as told by your health care provider. 6. Slowly return to the starting position. Repeat __________ times. Complete this exercise __________ times a day.   Shoulder flexion, standing 1. Stand and hold a broomstick, a cane, or a similar object. Place your hands a little more than shoulder width apart on the object. Your left / right hand should be palm up, and your other hand should be palm down. 2. Keep your elbow straight and your shoulder muscles relaxed. Push the stick up with your  healthy arm to raise your left / right arm in front of your body, and then over your head until you feel a stretch in your shoulder (flexion). ? Avoid shrugging your shoulder while you raise your arm. Keep your shoulder blade tucked down toward the middle of your back. 3. Hold for __________ seconds. 4. Slowly return to the starting position. Repeat __________ times. Complete this exercise __________ times a day.   Shoulder abduction, standing 1. Stand and hold a  broomstick, a cane, or a similar object. Place your hands a little more than shoulder width apart on the object. Your left / right hand should be palm up, and your other hand should be palm down. 2. Keep your elbow straight and your shoulder muscles relaxed. Push the object across your body toward your left / right side. Raise your left / right arm to the side of your body (abduction) until you feel a stretch in your shoulder. ? Do not raise your arm above shoulder height unless your health care provider tells you to do that. ? If directed, raise your arm over your head. ? Avoid shrugging your shoulder while you raise your arm. Keep your shoulder blade tucked down toward the middle of your back. 3. Hold for __________ seconds. 4. Slowly return to the starting position. Repeat __________ times. Complete this exercise __________ times a day. Internal rotation 1. Place your left / right hand behind your back, palm up. 2. Use your other hand to dangle an exercise band, a towel, or a similar object over your shoulder. Grasp the band with your left / right hand so you are holding on to both ends. 3. Gently pull up on the band until you feel a stretch in the front of your left / right shoulder. The movement of your arm toward the center of your body is called internal rotation. ? Avoid shrugging your shoulder while you raise your arm. Keep your shoulder blade tucked down toward the middle of your back. 4. Hold for __________ seconds. 5. Release the stretch by letting go of the band and lowering your hands. Repeat __________ times. Complete this exercise __________ times a day.   Strengthening exercises External rotation 1. Sit in a stable chair without armrests. 2. Secure an exercise band to a stable object at elbow height on your left / right side. 3. Place a soft object, such as a folded towel or a small pillow, between your left / right upper arm and your body to move your elbow about 4 inches (10  cm) away from your side. 4. Hold the end of the exercise band so it is tight and there is no slack. 5. Keeping your elbow pressed against the soft object, slowly move your forearm out, away from your abdomen (external rotation). Keep your body steady so only your forearm moves. 6. Hold for __________ seconds. 7. Slowly return to the starting position. Repeat __________ times. Complete this exercise __________ times a day.   Shoulder abduction 1. Sit in a stable chair without armrests, or stand up. 2. Hold a __________ weight in your left / right hand, or hold an exercise band with both hands. 3. Start with your arms straight down and your left / right palm facing in, toward your body. 4. Slowly lift your left / right hand out to your side (abduction). Do not lift your hand above shoulder height unless your health care provider tells you that this is safe. ? Keep your arms straight. ?  Avoid shrugging your shoulder while you do this movement. Keep your shoulder blade tucked down toward the middle of your back. 5. Hold for __________ seconds. 6. Slowly lower your arm, and return to the starting position. Repeat __________ times. Complete this exercise __________ times a day.   Shoulder extension 1. Sit in a stable chair without armrests, or stand up. 2. Secure an exercise band to a stable object in front of you so it is at shoulder height. 3. Hold one end of the exercise band in each hand. Your palms should face each other. 4. Straighten your elbows and lift your hands up to shoulder height. 5. Step back, away from the secured end of the exercise band, until the band is tight and there is no slack. 6. Squeeze your shoulder blades together as you pull your hands down to the sides of your thighs (extension). Stop when your hands are straight down by your sides. Do not let your hands go behind your body. 7. Hold for __________ seconds. 8. Slowly return to the starting position. Repeat __________  times. Complete this exercise __________ times a day. Shoulder row 1. Sit in a stable chair without armrests, or stand up. 2. Secure an exercise band to a stable object in front of you so it is at waist height. 3. Hold one end of the exercise band in each hand. Position your palms so that your thumbs are facing the ceiling (neutral position). 4. Bend each of your elbows to a 90-degree angle (right angle) and keep your upper arms at your sides. 5. Step back until the band is tight and there is no slack. 6. Slowly pull your elbows back behind you. 7. Hold for __________ seconds. 8. Slowly return to the starting position. Repeat __________ times. Complete this exercise __________ times a day. Shoulder press-ups 1. Sit in a stable chair that has armrests. Sit upright, with your feet flat on the floor. 2. Put your hands on the armrests so your elbows are bent and your fingers are pointing forward. Your hands should be about even with the sides of your body. 3. Push down on the armrests and use your arms to lift yourself off the chair. Straighten your elbows and lift yourself up as much as you comfortably can. ? Move your shoulder blades down, and avoid letting your shoulders move up toward your ears. ? Keep your feet on the ground. As you get stronger, your feet should support less of your body weight as you lift yourself up. 4. Hold for __________ seconds. 5. Slowly lower yourself back into the chair. Repeat __________ times. Complete this exercise __________ times a day.   Wall push-ups 1. Stand so you are facing a stable wall. Your feet should be about one arm-length away from the wall. 2. Lean forward and place your palms on the wall at shoulder height. 3. Keep your feet flat on the floor as you bend your elbows and lean forward toward the wall. 4. Hold for __________ seconds. 5. Straighten your elbows to push yourself back to the starting position. Repeat __________ times. Complete this  exercise __________ times a day.   This information is not intended to replace advice given to you by your health care provider. Make sure you discuss any questions you have with your health care provider. Document Revised: 10/05/2018 Document Reviewed: 07/13/2018 Elsevier Patient Education  2021 [employer]  Shoulder Pain Many things can cause shoulder pain, including:  An injury.  Moving the shoulder  in the same way again and again (overuse).  Joint pain (arthritis). Pain can come from:  Swelling and irritation (inflammation) of any part of the shoulder.  An injury to the shoulder joint.  An injury to: ? Tissues that connect muscle to bone (tendons). ? Tissues that connect bones to each other (ligaments). ? Bones. Follow these instructions at home: Watch for changes in your symptoms. Let your doctor know about them. Follow these instructions to help with your pain. If you have a sling:  Wear the sling as told by your doctor. Remove it only as told by your doctor.  Loosen the sling if your fingers: ? Tingle. ? Become numb. ? Turn cold and blue.  Keep the sling clean.  If the sling is not waterproof: ? Do not let it get wet. ? Take the sling off when you shower or bathe. Managing pain, stiffness, and swelling  If told, put ice on the painful area: ? Put ice in a plastic bag. ? Place a towel between your skin and the bag. ? Leave the ice on for 20 minutes, 2-3 times a day. Stop putting ice on if it does not help with the pain.  Squeeze a soft ball or a foam pad as much as possible. This prevents swelling in the shoulder. It also helps to strengthen the arm.   General instructions  Take over-the-counter and prescription medicines only as told by your doctor.  Keep all follow-up visits as told by your doctor. This is important. Contact a doctor if:  Your pain gets worse.  Medicine does not help your pain.  You have new pain in your arm, hand, or fingers. Get  help right away if:  Your arm, hand, or fingers: ? Tingle. ? Are numb. ? Are swollen. ? Are painful. ? Turn white or blue. Summary  Shoulder pain can be caused by many things. These include injury, moving the shoulder in the same away again and again, and joint pain.  Watch for changes in your symptoms. Let your doctor know about them.  This condition may be treated with a sling, ice, and pain medicine.  Contact your doctor if the pain gets worse or you have new pain. Get help right away if your arm, hand, or fingers tingle or get numb, swollen, or painful.  Keep all follow-up visits as told by your doctor. This is important. This information is not intended to replace advice given to you by your health care provider. Make sure you discuss any questions you have with your health care provider. Document Revised: 12/26/2017 Document Reviewed: 12/26/2017 Elsevier Patient Education  2021 [employer].    If you have lab work done today you will be contacted with your lab results within the next 2 weeks.  If you have not heard from [place] then please contact us. The fastest way to get your results is to register for My Chart.   IF you received an x-ray today, you will receive an invoice from [facility]. Please contact [facility] at [phone] with questions or concerns regarding your invoice.   IF you received labwork today, you will receive an invoice from [facility]. Please contact [facility] at [phone] with questions or concerns regarding your invoice.   Our billing staff will not be able to assist you with questions regarding bills from these companies.  You will be contacted with the lab results as soon as they are available. The fastest way to get your results is to activate  your My Chart account. Instructions are located on the last page of this paperwork. If you have not heard from [place] regarding the results in 2 weeks, please contact this office.

## 2020-07-29 ENCOUNTER — Encounter: Payer: Self-pay | Admitting: Physician Assistant

## 2020-07-29 ENCOUNTER — Ambulatory Visit: Payer: No Typology Code available for payment source | Admitting: Orthopaedic Surgery

## 2020-07-29 DIAGNOSIS — G8929 Other chronic pain: Secondary | ICD-10-CM

## 2020-07-29 DIAGNOSIS — M25512 Pain in left shoulder: Secondary | ICD-10-CM | POA: Diagnosis not present

## 2020-07-29 MED ORDER — METHOCARBAMOL 500 MG PO TABS: 500.0000 mg | ORAL_TABLET | Freq: Two times a day (BID) | ORAL | 0 refills | Status: AC | PRN

## 2020-07-29 MED ORDER — TRAMADOL HCL 50 MG PO TABS: 50.0000 mg | ORAL_TABLET | Freq: Three times a day (TID) | ORAL | 0 refills | Status: AC | PRN

## 2020-07-29 NOTE — Progress Notes (Signed)
Office Visit Note   Patient: Isaiah Fowler           Date of Birth: Dec 21, 1960           MRN: [id number] Visit Date: 07/29/2020              Requested by: Tobie Poet, FNP [address] [place],  [place] [address] PCP: Just, Azalee Course, FNP   Assessment & Plan: Visit Diagnoses:  1. Chronic left shoulder pain     Plan: Impression is left shoulder pain following an injury concerning for rotator cuff pathology.  At this point, I recommended MRI to further evaluate for structural abnormalities.  Have called in tramadol and Robaxin to take in the meantime.  He will follow up with [place] once the MRIs been completed.  Follow-Up Instructions: Return if symptoms worsen or fail to improve.   Orders:  No orders of the defined types were placed in this encounter.  No orders of the defined types were placed in this encounter.     Procedures: No procedures performed   Clinical Data: No additional findings.   Subjective: Chief Complaint  Patient presents with  . Left Shoulder - Pain    HPI patient is a pleasant 60 year old gentleman who comes in today with concerns about his left shoulder.  Approximately 3 weeks ago, his left knee buckled as he slipped on ice causing him to reach out with his left shoulder grabbing a handlebar on a schoolbus.  He was seen by his primary care provider where x-rays of his left shoulder were obtained.  X-rays were negative for acute findings or fracture.  He comes in today for further evaluation treatment recommendation.  The pain is primarily in the bicipital groove but also radiates into the biceps and deltoid.  He does note pain in the parascapular region at times as well.  He has increased pain when forward flexing his shoulder past 90 degrees as well as when he internally rotates his shoulder or lifts anything greater than 10 pounds.  He has been using BenGay, NSAIDs and prednisone without significant relief of symptoms.  Review of Systems as detailed in HPI.   All others reviewed and are negative.   Objective: Vital Signs: There were no vitals taken for this visit.  Physical Exam well-developed well-nourished gentleman in no acute distress.  Alert oriented x3.  Ortho Exam left shoulder exam reveals marked tenderness in the bicipital groove.  Forward flexion to about 120 degrees.  He can internally rotate to the side of his hip.  He has a positive empty can and belly press.  3 out of 5 strength throughout.  He is neurovascular intact distally.  Specialty Comments:  No specialty comments available.  Imaging: X-rays reviewed by me in canopy show no acute or structural abnormalities   PMFS History: Patient Active Problem List   Diagnosis Date Noted  . Pure hypercholesterolemia 03/25/2019  . Chronic pain of right ankle 03/25/2019  . Eczema 02/20/2019  . Pruritus 09/20/2017  . Candidiasis, intertrigo 07/13/2017  . Need for prophylactic vaccination and inoculation against influenza 04/12/2017  . Pain in joint involving right ankle and foot 04/12/2017  . Acute pain of right shoulder 03/07/2017  . Acute right ankle pain 03/07/2017  . Right foot pain 03/07/2017  . Sprain of shoulder, right 03/07/2017  . Sprain of right ankle 03/07/2017  . Impotence 01/08/2016  . OSA on CPAP 01/08/2016  . Essential hypertension, benign 10/29/2012   Past Medical History:  Diagnosis Date  . Allergy   . Hypertension     History reviewed. No pertinent family history.  Past Surgical History:  Procedure Laterality Date  . DG GREAT TOE RIGHT FOOT Right    FOOT AND ANKLE N [address] / PLATES, SCREWS  . KNEE ARTHROSCOPY WITH MENISCAL REPAIR Left 2011   during [place] deployment   Social History   Occupational History  . Occupation: Curator  Tobacco Use  . Smoking status: Never Smoker  . Smokeless tobacco: Never Used  Substance and Sexual Activity  . Alcohol use: Yes    Alcohol/week: 2.0 standard drinks    Types: 2 Standard drinks or equivalent per  week    Comment: socially; once a week  . Drug use: No  . Sexual activity: Not on file

## 2020-08-14 ENCOUNTER — Telehealth: Payer: Self-pay | Admitting: Orthopaedic Surgery

## 2020-08-14 NOTE — Telephone Encounter (Signed)
Called pt 1X and left vm to call and set MRI review appt with Dr. Roda Shutters after 08/19/20. Will try again later

## 2020-08-19 ENCOUNTER — Other Ambulatory Visit: Payer: No Typology Code available for payment source

## 2020-09-21 ENCOUNTER — Ambulatory Visit: Payer: No Typology Code available for payment source | Admitting: Family Medicine

## 2020-10-08 NOTE — Telephone Encounter (Signed)
Opened in error

## 2021-06-10 ENCOUNTER — Other Ambulatory Visit: Payer: Self-pay

## 2021-06-10 ENCOUNTER — Encounter: Payer: Self-pay | Admitting: Emergency Medicine

## 2021-06-10 ENCOUNTER — Ambulatory Visit: Payer: No Typology Code available for payment source | Admitting: Emergency Medicine

## 2021-06-10 VITALS — BP 122/70 | HR 59 | Ht 67.0 in | Wt 178.0 lb

## 2021-06-10 DIAGNOSIS — Z13228 Encounter for screening for other metabolic disorders: Secondary | ICD-10-CM | POA: Diagnosis not present

## 2021-06-10 DIAGNOSIS — I10 Essential (primary) hypertension: Secondary | ICD-10-CM

## 2021-06-10 DIAGNOSIS — Z13220 Encounter for screening for lipoid disorders: Secondary | ICD-10-CM | POA: Diagnosis not present

## 2021-06-10 DIAGNOSIS — Z1329 Encounter for screening for other suspected endocrine disorder: Secondary | ICD-10-CM | POA: Diagnosis not present

## 2021-06-10 DIAGNOSIS — Z130 Encounter for screening for diseases of the blood and blood-forming organs and certain disorders involving the immune mechanism: Secondary | ICD-10-CM

## 2021-06-10 DIAGNOSIS — G4733 Obstructive sleep apnea (adult) (pediatric): Secondary | ICD-10-CM

## 2021-06-10 DIAGNOSIS — E785 Hyperlipidemia, unspecified: Secondary | ICD-10-CM

## 2021-06-10 DIAGNOSIS — Z125 Encounter for screening for malignant neoplasm of prostate: Secondary | ICD-10-CM | POA: Diagnosis not present

## 2021-06-10 DIAGNOSIS — Z1211 Encounter for screening for malignant neoplasm of colon: Secondary | ICD-10-CM

## 2021-06-10 DIAGNOSIS — Z0000 Encounter for general adult medical examination without abnormal findings: Secondary | ICD-10-CM

## 2021-06-10 DIAGNOSIS — Z9989 Dependence on other enabling machines and devices: Secondary | ICD-10-CM

## 2021-06-10 LAB — COMPREHENSIVE METABOLIC PANEL
ALT: 20 U/L (ref 0–53)
AST: 19 U/L (ref 0–37)
Albumin: 4.5 g/dL (ref 3.5–5.2)
Alkaline Phosphatase: 43 U/L (ref 39–117)
BUN: 14 mg/dL (ref 6–23)
CO2: 30 mEq/L (ref 19–32)
Calcium: 10.1 mg/dL (ref 8.4–10.5)
Chloride: 100 mEq/L (ref 96–112)
Creatinine, Ser: 1.04 mg/dL (ref 0.40–1.50)
GFR: 77.11 mL/min (ref >60.00)
Glucose, Bld: 98 mg/dL (ref 70–99)
Potassium: 4.1 mEq/L (ref 3.5–5.1)
Sodium: 137 mEq/L (ref 135–145)
Total Bilirubin: 1.2 mg/dL (ref 0.2–1.2)
Total Protein: 7.6 g/dL (ref 6.0–8.3)

## 2021-06-10 LAB — LIPID PANEL
Cholesterol: 224 mg/dL — ABNORMAL HIGH (ref 0–200)
HDL: 61.1 mg/dL (ref >39.00)
LDL Cholesterol: 151 mg/dL — ABNORMAL HIGH (ref 0–99)
NonHDL: 162.63
Total CHOL/HDL Ratio: 4
Triglycerides: 57 mg/dL (ref 0.0–149.0)
VLDL: 11.4 mg/dL (ref 0.0–40.0)

## 2021-06-10 LAB — CBC WITH DIFFERENTIAL/PLATELET
Basophils Absolute: 0.1 10*3/uL (ref 0.0–0.1)
Basophils Relative: 1 % (ref 0.0–3.0)
Eosinophils Absolute: 0.1 10*3/uL (ref 0.0–0.7)
Eosinophils Relative: 1.8 % (ref 0.0–5.0)
HCT: 41.4 % (ref 39.0–52.0)
Hemoglobin: 14.2 g/dL (ref 13.0–17.0)
Lymphocytes Relative: 34.7 % (ref 12.0–46.0)
Lymphs Abs: 1.9 10*3/uL (ref 0.7–4.0)
MCHC: 34.3 g/dL (ref 30.0–36.0)
MCV: 89.1 fl (ref 78.0–100.0)
Monocytes Absolute: 0.6 10*3/uL (ref 0.1–1.0)
Monocytes Relative: 10.7 % (ref 3.0–12.0)
Neutro Abs: 2.9 10*3/uL (ref 1.4–7.7)
Neutrophils Relative %: 51.8 % (ref 43.0–77.0)
Platelets: 261 10*3/uL (ref 150.0–400.0)
RBC: 4.64 Mil/uL (ref 4.22–5.81)
RDW: 12.9 % (ref 11.5–15.5)
WBC: 5.5 10*3/uL (ref 4.0–10.5)

## 2021-06-10 LAB — PSA: PSA: 0.46 ng/mL (ref 0.10–4.00)

## 2021-06-10 LAB — HEMOGLOBIN A1C: Hgb A1c MFr Bld: 6 % (ref 4.6–6.5)

## 2021-06-10 NOTE — Patient Instructions (Signed)

## 2021-06-10 NOTE — Progress Notes (Signed)
Isaiah Fowler 60 y.o.   Chief Complaint  Patient presents with   New Patient (Initial Visit)    HISTORY OF PRESENT ILLNESS: This is a 60 y.o. male here for annual exam. History of hypertension, obstructive sleep apnea, and dyslipidemia. Veteran so he also goes to [facility] in [place]. Requesting colonoscopy referral. No other complaints or medical concerns today.  HPI   Prior to Admission medications   Medication Sig Start Date End Date Taking? Authorizing Provider  atorvastatin (LIPITOR) 80 MG tablet  11/28/18  Yes [provider]  cyclobenzaprine (FLEXERIL) 10 MG tablet Take 1 tablet (10 mg total) by mouth 3 (three) times daily as needed for muscle spasms. 07/23/20  Yes Just, Azalee Course, FNP  diclofenac sodium (VOLTAREN) 1 % GEL Apply 2 g topically 4 (four) times daily. 03/29/19  Yes Cristie Hem, PA-C  gabapentin (NEURONTIN) 300 MG capsule Take 1 capsule (300 mg total) by mouth 3 (three) times daily. 01/20/20  Yes Lezlie Lye, Meda Coffee, MD  hydrOXYzine (ATARAX/VISTARIL) 25 MG tablet  11/28/18  Yes [provider]  ibuprofen (ADVIL) 800 MG tablet Take 1 tablet (800 mg total) by mouth every 6 (six) hours as needed. 03/30/20  Yes Candelaria Stagers, DPM  lisinopril-hydrochlorothiazide (ZESTORETIC) 10-12.5 MG tablet Take 2 tablets by mouth daily. 07/23/20  Yes Just, Azalee Course, FNP  meloxicam (MOBIC) 15 MG tablet Take 1 tablet (15 mg total) by mouth daily. 01/20/20  Yes Lezlie Lye, Meda Coffee, MD  methocarbamol (ROBAXIN) 500 MG tablet Take 1 tablet (500 mg total) by mouth 2 (two) times daily as needed. 07/29/20  Yes Cristie Hem, PA-C  oxyCODONE-acetaminophen (PERCOCET) 10-325 MG tablet Take 1 tablet by mouth every 4 (four) hours as needed for pain. 03/30/20  Yes Candelaria Stagers, DPM  oxyCODONE-acetaminophen (PERCOCET) 5-325 MG tablet Take 1-2 tablets by mouth every 6 (six) hours as needed for severe pain. 03/30/20  Yes Candelaria Stagers, DPM  Tacrolimus 0.1 % CREA Apply 2 application  topically daily. 02/20/19  Yes Janeece Agee, NP  terbinafine (LAMISIL) 250 MG tablet Take 1 tablet (250 mg total) by mouth daily. 02/27/19  Yes Janeece Agee, NP  traMADol (ULTRAM) 50 MG tablet Take 1 tablet (50 mg total) by mouth 3 (three) times daily as needed. 07/29/20  Yes Cristie Hem, PA-C  acetaminophen (TYLENOL) 325 MG tablet  03/01/19   [provider]  chlorhexidine (HIBICLENS) 4 % external liquid  08/31/18   [provider]    Allergies  Allergen Reactions   Other     Patient Active Problem List   Diagnosis Date Noted   Pure hypercholesterolemia 03/25/2019   Chronic pain of right ankle 03/25/2019   Eczema 02/20/2019   Pruritus 09/20/2017   Candidiasis, intertrigo 07/13/2017   Need for prophylactic vaccination and inoculation against influenza 04/12/2017   Pain in joint involving right ankle and foot 04/12/2017   Acute pain of right shoulder 03/07/2017   Acute right ankle pain 03/07/2017   Right foot pain 03/07/2017   Sprain of shoulder, right 03/07/2017   Sprain of right ankle 03/07/2017   Impotence 01/08/2016   OSA on CPAP 01/08/2016   Essential hypertension, benign 10/29/2012    Past Medical History:  Diagnosis Date   Allergy    Hypertension     Past Surgical History:  Procedure Laterality Date   DG GREAT TOE RIGHT FOOT Right    FOOT AND ANKLE N [address] ST / PLATES, SCREWS   KNEE ARTHROSCOPY  WITH MENISCAL REPAIR Left 2011   during [place] deployment    Social History   Socioeconomic History   Marital status: Married    Spouse name: Ann Held   Number of children: 4   Years of education: 12th grade   Highest education level: Not on file  Occupational History   Occupation: Dealer  Tobacco Use   Smoking status: Never   Smokeless tobacco: Never  Substance and Sexual Activity   Alcohol use: Yes    Alcohol/week: 2.0 standard drinks    Types: 2 Standard drinks or equivalent per week    Comment: socially; once a week   Drug use: No    Sexual activity: Not on file  Other Topics Concern   Not on file  Social History Narrative   Lives with his wife and their 4 sons.   Social Determinants of Health   Financial Resource Strain: Not on file  Food Insecurity: Not on file  Transportation Needs: Not on file  Physical Activity: Not on file  Stress: Not on file  Social Connections: Not on file  Intimate Partner Violence: Not on file    No family history on file.   Review of Systems  Constitutional: Negative.  Negative for chills and fever.  HENT: Negative.  Negative for congestion and sore throat.   Eyes: Negative.   Respiratory: Negative.  Negative for cough and shortness of breath.   Cardiovascular: Negative.  Negative for chest pain and palpitations.  Gastrointestinal: Negative.  Negative for abdominal pain, diarrhea, nausea and vomiting.  Genitourinary: Negative.  Negative for dysuria and hematuria.  Skin: Negative.  Negative for rash.  Neurological: Negative.  Negative for dizziness and headaches.  All other systems reviewed and are negative.   Physical Exam Vitals reviewed.  Constitutional:      Appearance: Normal appearance.  HENT:     Head: Normocephalic.     Right Ear: Tympanic membrane, ear canal and external ear normal.     Left Ear: Tympanic membrane, ear canal and external ear normal.     Mouth/Throat:     Mouth: Mucous membranes are moist.     Pharynx: Oropharynx is clear.  Eyes:     Extraocular Movements: Extraocular movements intact.     Conjunctiva/sclera: Conjunctivae normal.     Pupils: Pupils are equal, round, and reactive to light.  Cardiovascular:     Rate and Rhythm: Normal rate and regular rhythm.     Pulses: Normal pulses.     Heart sounds: Normal heart sounds.  Pulmonary:     Effort: Pulmonary effort is normal.     Breath sounds: Normal breath sounds.  Abdominal:     General: Bowel sounds are normal. There is no distension.     Palpations: Abdomen is soft.     Tenderness:  There is no abdominal tenderness.  Musculoskeletal:        General: Normal range of motion.     Cervical back: Normal range of motion and neck supple.     Right lower leg: No edema.     Left lower leg: No edema.  Skin:    General: Skin is warm and dry.     Capillary Refill: Capillary refill takes less than 2 seconds.  Neurological:     General: No focal deficit present.     Mental Status: He is alert and oriented to person, place, and time.  Psychiatric:        Mood and Affect: Mood normal.  Behavior: Behavior normal.     ASSESSMENT & PLAN: Problem List Items Addressed This Visit       Cardiovascular and Mediastinum   Essential hypertension, benign     Respiratory   OSA on CPAP   Other Visit Diagnoses     Routine general medical examination at a health care facility    -  Primary   Colon cancer screening       Relevant Orders   Ambulatory referral to Gastroenterology   Prostate cancer screening       Relevant Orders   PSA(Must document that pt has been informed of limitations of PSA testing.)   Screening for deficiency anemia       Relevant Orders   CBC with Differential   Screening for lipoid disorders       Relevant Orders   Lipid panel   Screening for endocrine, metabolic and immunity disorder       Relevant Orders   Comprehensive metabolic panel   Hemoglobin A1c   Dyslipidemia          Modifiable risk factors discussed with patient. Anticipatory guidance according to age provided. The following topics were also discussed: Social Determinants of Health Smoking.  Non-smoker Diet and nutrition Benefits of exercise Cancer screening and need for colon cancer screening with colonoscopy Vaccinations recommendations. Cardiovascular risk assessment Hypertension and cardiovascular risk associated with this condition Review of all medications Lipid profile done today Mental health including depression and anxiety Fall and accident prevention  Patient  Instructions  Health Maintenance, Male Adopting a healthy lifestyle and getting preventive care are important in promoting health and wellness. Ask your health care provider about: The right schedule for you to have regular tests and exams. Things you can do on your own to prevent diseases and keep yourself healthy. What should I know about diet, weight, and exercise? Eat a healthy diet  Eat a diet that includes plenty of vegetables, fruits, low-fat dairy products, and lean protein. Do not eat a lot of foods that are high in solid fats, added sugars, or sodium. Maintain a healthy weight Body mass index (BMI) is a measurement that can be used to identify possible weight problems. It estimates body fat based on height and weight. Your health care provider can help determine your BMI and help you achieve or maintain a healthy weight. Get regular exercise Get regular exercise. This is one of the most important things you can do for your health. Most adults should: Exercise for at least 150 minutes each week. The exercise should increase your heart rate and make you sweat (moderate-intensity exercise). Do strengthening exercises at least twice a week. This is in addition to the moderate-intensity exercise. Spend less time sitting. Even light physical activity can be beneficial. Watch cholesterol and blood lipids Have your blood tested for lipids and cholesterol at 62 years of age, then have this test every 5 years. You may need to have your cholesterol levels checked more often if: Your lipid or cholesterol levels are high. You are older than 62 years of age. You are at high risk for heart disease. What should I know about cancer screening? Many types of cancers can be detected early and may often be prevented. Depending on your health history and family history, you may need to have cancer screening at various ages. This may include screening for: Colorectal cancer. Prostate cancer. Skin  cancer. Lung cancer. What should I know about heart disease, diabetes, and high blood pressure? Blood  pressure and heart disease High blood pressure causes heart disease and increases the risk of stroke. This is more likely to develop in people who have high blood pressure readings or are overweight. Talk with your health care provider about your target blood pressure readings. Have your blood pressure checked: Every 3-5 years if you are 29-19 years of age. Every year if you are 28 years old or older. If you are between the ages of 18 and 3 and are a current or former smoker, ask your health care provider if you should have a one-time screening for abdominal aortic aneurysm (AAA). Diabetes Have regular diabetes screenings. This checks your fasting blood sugar level. Have the screening done: Once every three years after age 28 if you are at a normal weight and have a low risk for diabetes. More often and at a younger age if you are overweight or have a high risk for diabetes. What should I know about preventing infection? Hepatitis B If you have a higher risk for hepatitis B, you should be screened for this virus. Talk with your health care provider to find out if you are at risk for hepatitis B infection. Hepatitis C Blood testing is recommended for: Everyone born from [birth year] through 1967. Anyone with known risk factors for hepatitis C. Sexually transmitted infections (STIs) You should be screened each year for STIs, including gonorrhea and chlamydia, if: You are sexually active and are younger than 62 years of age. You are older than 62 years of age and your health care provider tells you that you are at risk for this type of infection. Your sexual activity has changed since you were last screened, and you are at increased risk for chlamydia or gonorrhea. Ask your health care provider if you are at risk. Ask your health care provider about whether you are at high risk for HIV. Your health  care provider may recommend a prescription medicine to help prevent HIV infection. If you choose to take medicine to prevent HIV, you should first get tested for HIV. You should then be tested every 3 months for as long as you are taking the medicine. Follow these instructions at home: Alcohol use Do not drink alcohol if your health care provider tells you not to drink. If you drink alcohol: Limit how much you have to 0-2 drinks a day. Know how much alcohol is in your drink. In the [place] one drink equals one 12 oz bottle of beer (355 mL), one 5 oz glass of wine (148 mL), or one 1 oz glass of hard liquor (44 mL). Lifestyle Do not use any products that contain nicotine or tobacco. These products include cigarettes, chewing tobacco, and vaping devices, such as e-cigarettes. If you need help quitting, ask your health care provider. Do not use street drugs. Do not share needles. Ask your health care provider for help if you need support or information about quitting drugs. General instructions Schedule regular health, dental, and eye exams. Stay current with your vaccines. Tell your health care provider if: You often feel depressed. You have ever been abused or do not feel safe at home. Summary Adopting a healthy lifestyle and getting preventive care are important in promoting health and wellness. Follow your health care provider's instructions about healthy diet, exercising, and getting tested or screened for diseases. Follow your health care provider's instructions on monitoring your cholesterol and blood pressure. This information is not intended to replace advice given to you by your health care provider.  Make sure you discuss any questions you have with your health care provider. Document Revised: 11/02/2020 Document Reviewed: 11/02/2020 Elsevier Patient Education  2022 [place], MD [facility]

## 2021-07-25 IMAGING — DX DG SHOULDER 2+V*L*
3 series · 3 of 3 positions shown · non-contrast
Comparison: None.

CLINICAL DATA: 59-year-old male with left shoulder pain.

EXAM:
LEFT SHOULDER - 2+ VIEW

[shoulder ap]
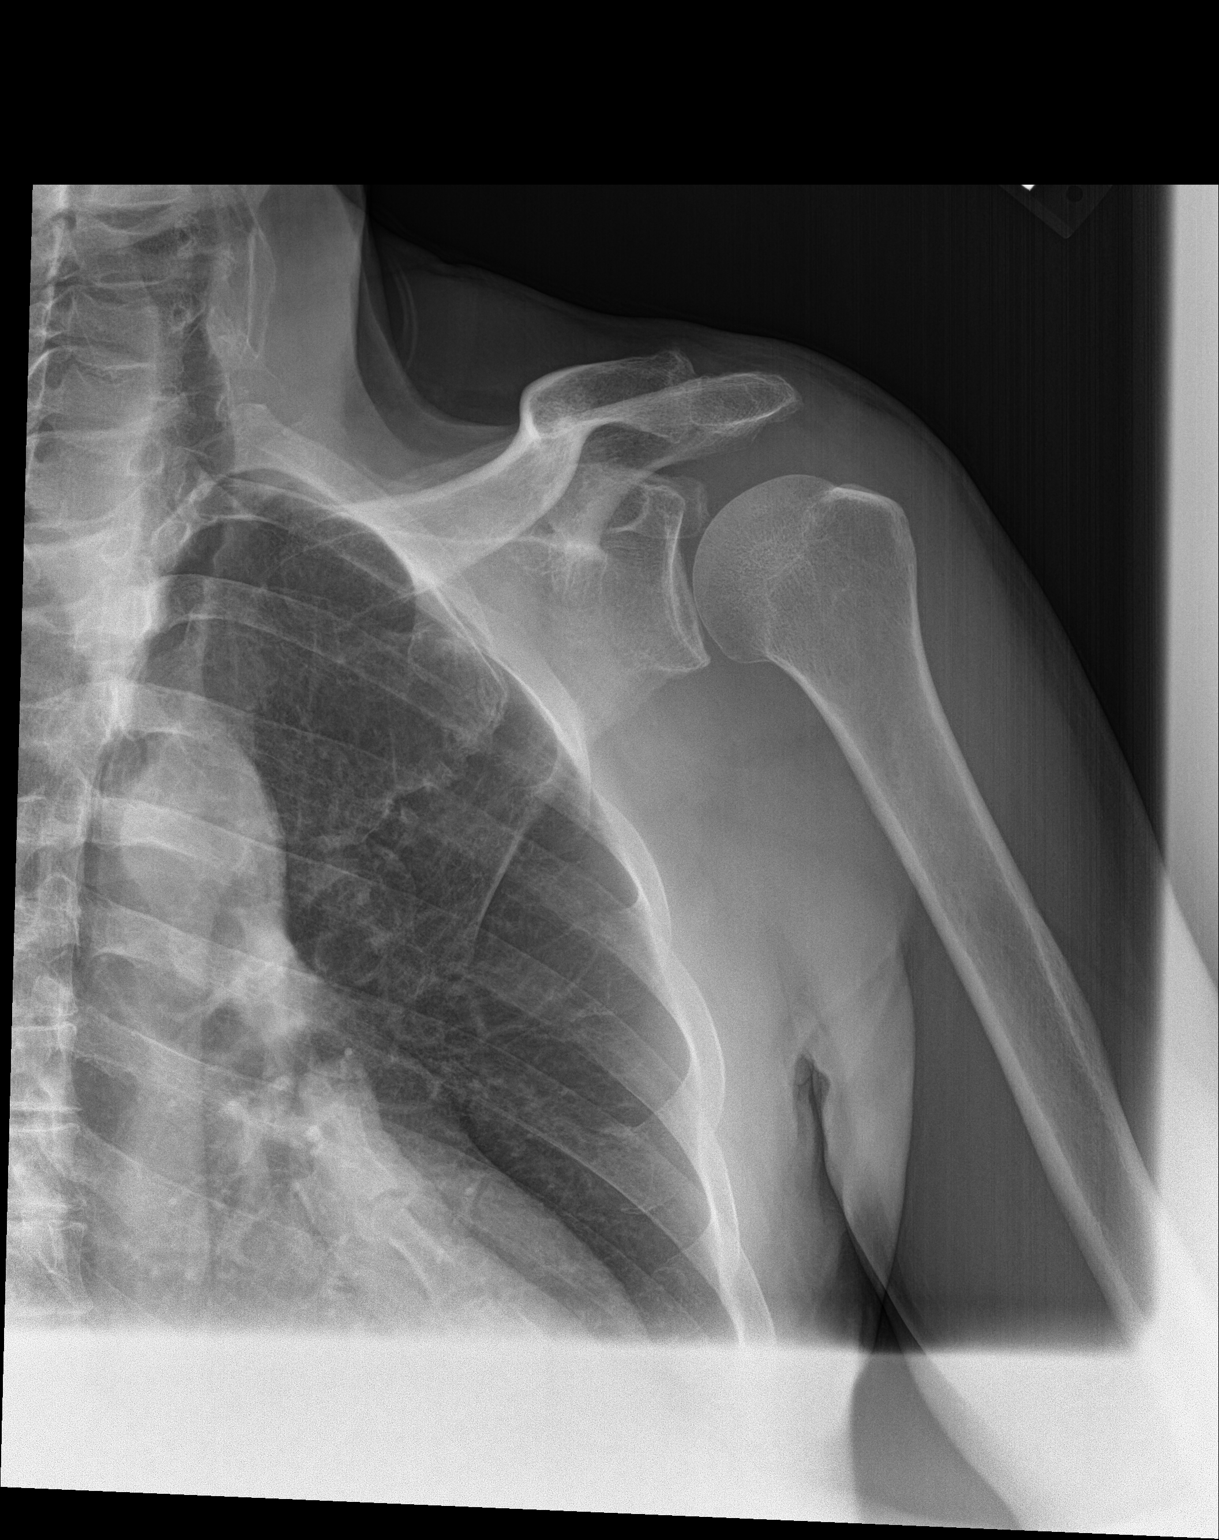

[shoulder y-view]
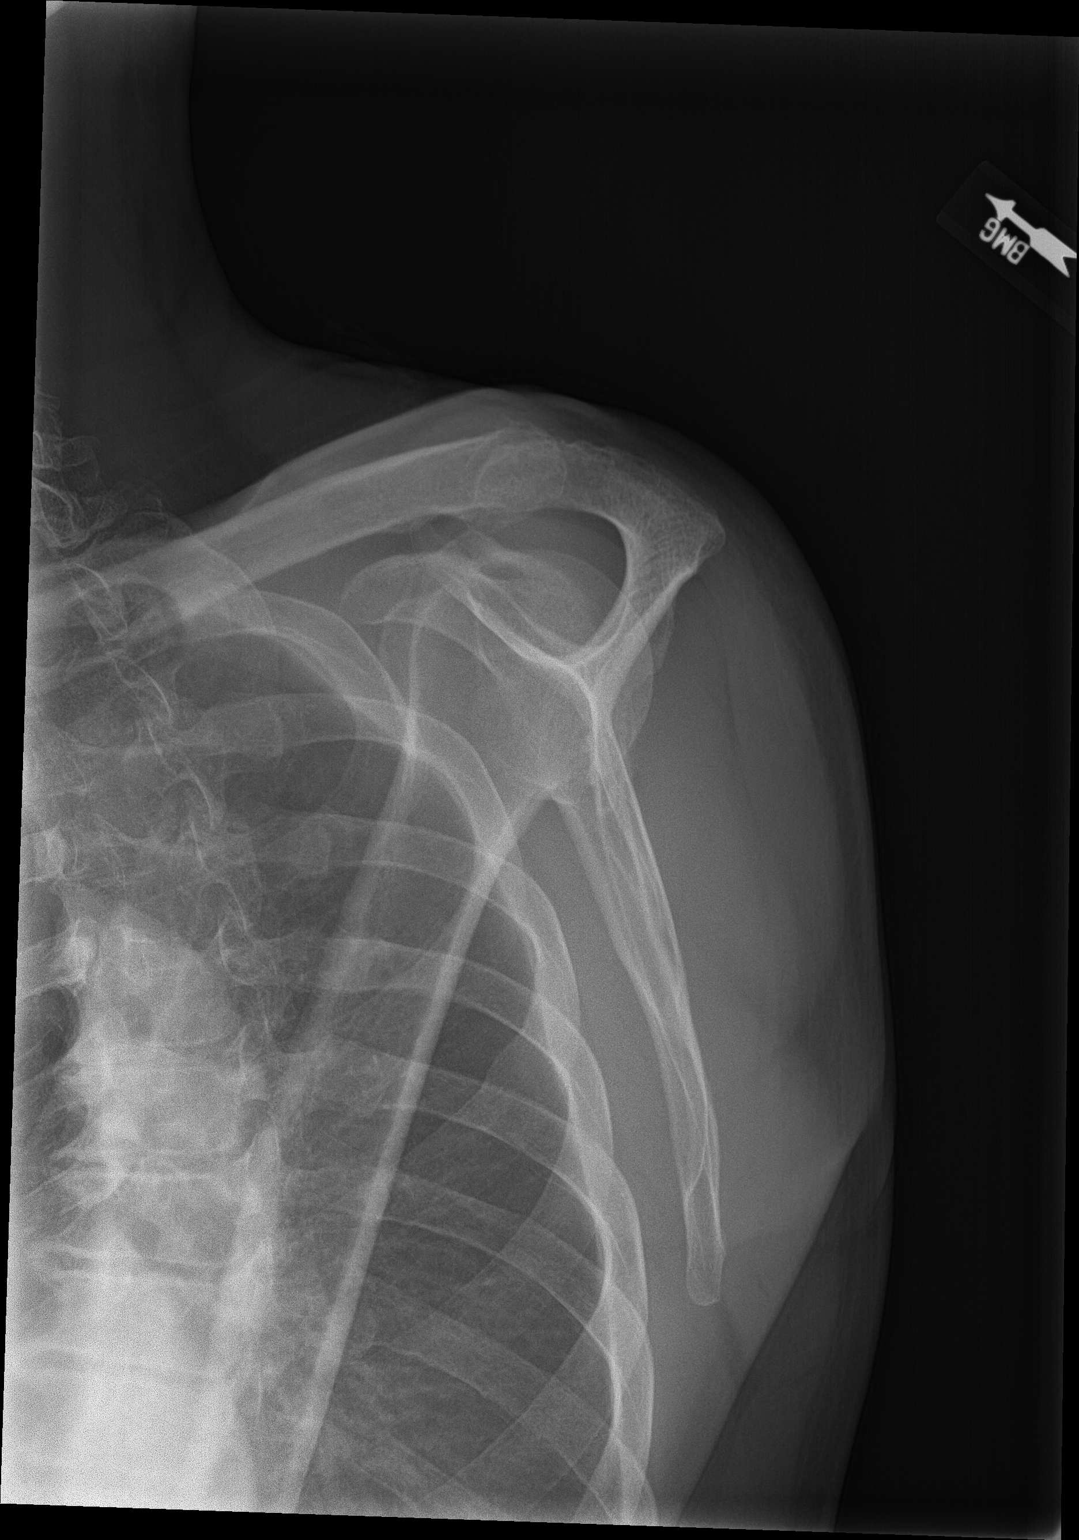

[shoulder axial]
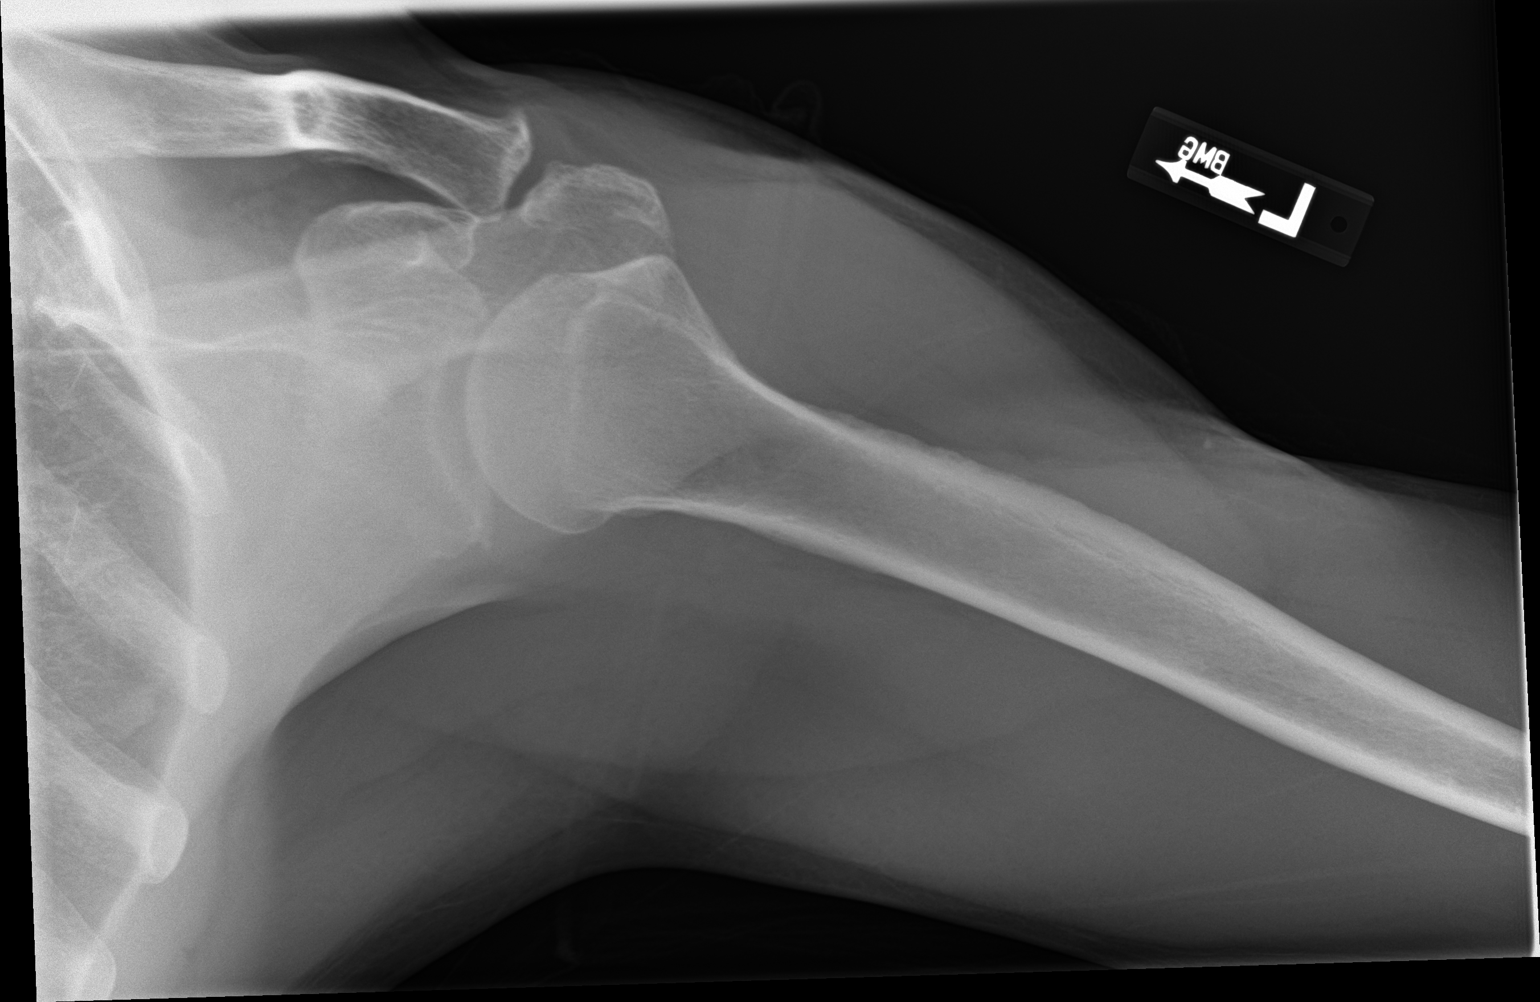

[3 of 3 positions shown; findings below may reference images not displayed]

FINDINGS: There is no evidence of fracture or dislocation. There is no
evidence of arthropathy or other focal bone abnormality. Soft
tissues are unremarkable.
IMPRESSION: Negative.

## 2021-08-25 ENCOUNTER — Ambulatory Visit (INDEPENDENT_AMBULATORY_CARE_PROVIDER_SITE_OTHER): Payer: No Typology Code available for payment source

## 2021-08-25 ENCOUNTER — Ambulatory Visit: Payer: No Typology Code available for payment source | Admitting: Podiatry

## 2021-08-25 ENCOUNTER — Other Ambulatory Visit: Payer: Self-pay

## 2021-08-25 DIAGNOSIS — M7751 Other enthesopathy of right foot: Secondary | ICD-10-CM

## 2021-08-25 DIAGNOSIS — Z9889 Other specified postprocedural states: Secondary | ICD-10-CM

## 2021-08-30 ENCOUNTER — Telehealth: Payer: Self-pay | Admitting: Podiatry

## 2021-08-30 NOTE — Progress Notes (Signed)
?Subjective:  ?Patient ID: Isaiah Fowler, male    DOB: 20-Jan-1961,  MRN: [id number] ? ?Chief Complaint  ?Patient presents with  ? Foot Pain  ?  Right foot pain ?Numbness in foot   ? ? ?61 y.o. male presents with the above complaint.  Patient presents with new complaint of right ankle pain.  Patient states is constant pain and swelling in the right ankle.  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  He states he went to 2019 to civilian doctor for which they told him that he may have a fracture.  He states that since then it has been painful has progressed to gotten worse.  His left side is doing well.  He wanted to discuss treatment options.  He denies any other acute complaints. ? ? ?Review of Systems: Negative except as noted in the HPI. Denies N/V/F/Ch. ? ?Past Medical History:  ?Diagnosis Date  ? Allergy   ? Hypertension   ? ? ?Current Outpatient Medications:  ?  acetaminophen (TYLENOL) 325 MG tablet, , Disp: , Rfl:  ?  atorvastatin (LIPITOR) 80 MG tablet, , Disp: , Rfl:  ?  chlorhexidine (HIBICLENS) 4 % external liquid, , Disp: , Rfl:  ?  cyclobenzaprine (FLEXERIL) 10 MG tablet, Take 1 tablet (10 mg total) by mouth 3 (three) times daily as needed for muscle spasms., Disp: 30 tablet, Rfl: 1 ?  diclofenac sodium (VOLTAREN) 1 % GEL, Apply 2 g topically 4 (four) times daily., Disp: 150 g, Rfl: 1 ?  gabapentin (NEURONTIN) 300 MG capsule, Take 1 capsule (300 mg total) by mouth 3 (three) times daily., Disp: 90 capsule, Rfl: 1 ?  hydrOXYzine (ATARAX/VISTARIL) 25 MG tablet, , Disp: , Rfl:  ?  ibuprofen (ADVIL) 800 MG tablet, Take 1 tablet (800 mg total) by mouth every 6 (six) hours as needed., Disp: 60 tablet, Rfl: 1 ?  lisinopril-hydrochlorothiazide (ZESTORETIC) 10-12.5 MG tablet, Take 2 tablets by mouth daily., Disp: 90 tablet, Rfl: 1 ?  meloxicam (MOBIC) 15 MG tablet, Take 1 tablet (15 mg total) by  mouth daily., Disp: 30 tablet, Rfl: 1 ?  methocarbamol (ROBAXIN) 500 MG tablet, Take 1 tablet (500 mg total) by mouth 2 (two) times daily as needed., Disp: 20 tablet, Rfl: 0 ?  oxyCODONE-acetaminophen (PERCOCET) 10-325 MG tablet, Take 1 tablet by mouth every 4 (four) hours as needed for pain., Disp: 30 tablet, Rfl: 0 ?  oxyCODONE-acetaminophen (PERCOCET) 5-325 MG tablet, Take 1-2 tablets by mouth every 6 (six) hours as needed for severe pain., Disp: 20 tablet, Rfl: 0 ?  Tacrolimus 0.1 % CREA, Apply 2 application topically daily., Disp: 30 g, Rfl: 1 ?  terbinafine (LAMISIL) 250 MG tablet, Take 1 tablet (250 mg total) by mouth daily., Disp: 14 tablet, Rfl: 0 ?  traMADol (ULTRAM) 50 MG tablet, Take 1 tablet (50 mg total) by mouth 3 (three) times daily as needed., Disp: 30 tablet, Rfl: 0 ? ?Social History  ? ?Tobacco Use  ?Smoking Status Never  ?Smokeless Tobacco Never  ? ? ?Allergies  ?Allergen Reactions  ? Other   ? ?Objective:  ?There were no vitals filed for this visit. ?There is no height or weight on file to calculate BMI. ?Constitutional Well developed. ?Well nourished.  ?Vascular Dorsalis pedis pulses palpable bilaterally. ?Posterior tibial pulses palpable bilaterally. ?Capillary refill normal to all digits.  ?No cyanosis or  clubbing noted. ?Pedal hair growth normal.  ?Neurologic Normal speech. ?Oriented to person, place, and time. ?Epicritic sensation to light touch grossly present bilaterally.  ?Dermatologic Nails well groomed and normal in appearance. ?No open wounds. ?No skin lesions.  ?Orthopedic: Pain on palpation to the right ankle medial and lateral gutter pain with range of motion of the ankle joint.  Mild deep intra-articular pain noted.  No pain at the Achilles tendon, peroneal tendon, ATFL ligament or posterior tibial tendon.  No extensor tendinitis noted.  ? ?Radiographs: 3 views of skeletally mature adult right foot and ankle: No acute break or fracture noted.  Signs of osteoarthritis noted mild to  moderate.  No osteophytes or loose ligaments noted.  MPJ fusion and consolidation noted to the right side.  Hardware is intact.  No signs of loosening or backing out noted.  Subtalar joint arthritis noted.  Midfoot arthritis noted. ?Assessment:  ? ?1. Capsulitis of ankle, right   ? ?Plan:  ?Patient was evaluated and treated and all questions answered. ? 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  He does have some mild to moderate arthritic changes in the ankle joint.  He has some more subtalar joint arthritis that could be superimposing on his pain.  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  Patient states understanding. ?-Currently he is managing his pain and would like to hold off on the injection.  I discussed with him if he has an acute flareup that is not being controlled with over-the-counter medication he can come see me and I can do a steroid injection.  Patient states understanding. ? ?No follow-ups on file.  ?

## 2021-08-30 NOTE — Telephone Encounter (Signed)
Isaiah Fowler called because he needs his notes from his last office visit for [facility]. He has today at 3pm. He said he has already spoke with you about it.  ?

## 2021-11-05 ENCOUNTER — Encounter: Payer: Self-pay | Admitting: Medical

## 2021-12-15 ENCOUNTER — Ambulatory Visit: Payer: No Typology Code available for payment source | Admitting: Emergency Medicine

## 2021-12-15 ENCOUNTER — Encounter: Payer: Self-pay | Admitting: Emergency Medicine

## 2021-12-15 VITALS — BP 132/76 | HR 58 | Temp 97.9°F | Ht 66.0 in | Wt 178.5 lb

## 2021-12-15 DIAGNOSIS — M25561 Pain in right knee: Secondary | ICD-10-CM

## 2021-12-15 DIAGNOSIS — Z9989 Dependence on other enabling machines and devices: Secondary | ICD-10-CM

## 2021-12-15 DIAGNOSIS — G8929 Other chronic pain: Secondary | ICD-10-CM

## 2021-12-15 DIAGNOSIS — E785 Hyperlipidemia, unspecified: Secondary | ICD-10-CM

## 2021-12-15 DIAGNOSIS — I10 Essential (primary) hypertension: Secondary | ICD-10-CM

## 2021-12-15 DIAGNOSIS — G4733 Obstructive sleep apnea (adult) (pediatric): Secondary | ICD-10-CM

## 2021-12-15 DIAGNOSIS — Z1211 Encounter for screening for malignant neoplasm of colon: Secondary | ICD-10-CM

## 2021-12-15 NOTE — Patient Instructions (Signed)
Hypertension, Adult High blood pressure (hypertension) is when the force of blood pumping through the arteries is too strong. The arteries are the blood vessels that carry blood from the heart throughout the body. Hypertension forces the heart to work harder to pump blood and may cause arteries to become narrow or stiff. Untreated or uncontrolled hypertension can lead to a heart attack, heart failure, a stroke, kidney disease, and other problems. A blood pressure reading consists of a higher number over a lower number. Ideally, your blood pressure should be below 120/80. The first ("top") number is called the systolic pressure. It is a measure of the pressure in your arteries as your heart beats. The second ("bottom") number is called the diastolic pressure. It is a measure of the pressure in your arteries as the heart relaxes. What are the causes? The exact cause of this condition is not known. There are some conditions that result in high blood pressure. What increases the risk? Certain factors may make you more likely to develop high blood pressure. Some of these risk factors are under your control, including: Smoking. Not getting enough exercise or physical activity. Being overweight. Having too much fat, sugar, calories, or salt (sodium) in your diet. Drinking too much alcohol. Other risk factors include: Having a personal history of heart disease, diabetes, high cholesterol, or kidney disease. Stress. Having a family history of high blood pressure and high cholesterol. Having obstructive sleep apnea. Age. The risk increases with age. What are the signs or symptoms? High blood pressure may not cause symptoms. Very high blood pressure (hypertensive crisis) may cause: Headache. Fast or irregular heartbeats (palpitations). Shortness of breath. Nosebleed. Nausea and vomiting. Vision changes. Severe chest pain, dizziness, and seizures. How is this diagnosed? This condition is diagnosed by  measuring your blood pressure while you are seated, with your arm resting on a flat surface, your legs uncrossed, and your feet flat on the floor. The cuff of the blood pressure monitor will be placed directly against the skin of your upper arm at the level of your heart. Blood pressure should be measured at least twice using the same arm. Certain conditions can cause a difference in blood pressure between your right and left arms. If you have a high blood pressure reading during one visit or you have normal blood pressure with other risk factors, you may be asked to: Return on a different day to have your blood pressure checked again. Monitor your blood pressure at home for 1 week or longer. If you are diagnosed with hypertension, you may have other blood or imaging tests to help your health care provider understand your overall risk for other conditions. How is this treated? This condition is treated by making healthy lifestyle changes, such as eating healthy foods, exercising more, and reducing your alcohol intake. You may be referred for counseling on a healthy diet and physical activity. Your health care provider may prescribe medicine if lifestyle changes are not enough to get your blood pressure under control and if: Your systolic blood pressure is above 130. Your diastolic blood pressure is above 80. Your personal target blood pressure may vary depending on your medical conditions, your age, and other factors. Follow these instructions at home: Eating and drinking  Eat a diet that is high in fiber and potassium, and low in sodium, added sugar, and fat. An example of this eating plan is called the DASH diet. DASH stands for Dietary Approaches to Stop Hypertension. To eat this way: Eat   plenty of fresh fruits and vegetables. Try to fill one half of your plate at each meal with fruits and vegetables. Eat whole grains, such as whole-wheat pasta, brown rice, or whole-grain bread. Fill about one  fourth of your plate with whole grains. Eat or drink low-fat dairy products, such as skim milk or low-fat yogurt. Avoid fatty cuts of meat, processed or cured meats, and poultry with skin. Fill about one fourth of your plate with lean proteins, such as fish, chicken without skin, beans, eggs, or tofu. Avoid pre-made and processed foods. These tend to be higher in sodium, added sugar, and fat. Reduce your daily sodium intake. Many people with hypertension should eat less than 1,500 mg of sodium a day. Do not drink alcohol if: Your health care provider tells you not to drink. You are pregnant, may be pregnant, or are planning to become pregnant. If you drink alcohol: Limit how much you have to: 0-1 drink a day for women. 0-2 drinks a day for men. Know how much alcohol is in your drink. In the [place] one drink equals one 12 oz bottle of beer (355 mL), one 5 oz glass of wine (148 mL), or one 1 oz glass of hard liquor (44 mL). Lifestyle  Work with your health care provider to maintain a healthy body weight or to lose weight. Ask what an ideal weight is for you. Get at least 30 minutes of exercise that causes your heart to beat faster (aerobic exercise) most days of the week. Activities may include walking, swimming, or biking. Include exercise to strengthen your muscles (resistance exercise), such as Pilates or lifting weights, as part of your weekly exercise routine. Try to do these types of exercises for 30 minutes at least 3 days a week. Do not use any products that contain nicotine or tobacco. These products include cigarettes, chewing tobacco, and vaping devices, such as e-cigarettes. If you need help quitting, ask your health care provider. Monitor your blood pressure at home as told by your health care provider. Keep all follow-up visits. This is important. Medicines Take over-the-counter and prescription medicines only as told by your health care provider. Follow directions carefully. Blood  pressure medicines must be taken as prescribed. Do not skip doses of blood pressure medicine. Doing this puts you at risk for problems and can make the medicine less effective. Ask your health care provider about side effects or reactions to medicines that you should watch for. Contact a health care provider if you: Think you are having a reaction to a medicine you are taking. Have headaches that keep coming back (recurring). Feel dizzy. Have swelling in your ankles. Have trouble with your vision. Get help right away if you: Develop a severe headache or confusion. Have unusual weakness or numbness. Feel faint. Have severe pain in your chest or abdomen. Vomit repeatedly. Have trouble breathing. These symptoms may be an emergency. Get help right away. Call 911. Do not wait to see if the symptoms will go away. Do not drive yourself to the hospital. Summary Hypertension is when the force of blood pumping through your arteries is too strong. If this condition is not controlled, it may put you at risk for serious complications. Your personal target blood pressure may vary depending on your medical conditions, your age, and other factors. For most people, a normal blood pressure is less than 120/80. Hypertension is treated with lifestyle changes, medicines, or a combination of both. Lifestyle changes include losing weight, eating a healthy,   low-sodium diet, exercising more, and limiting alcohol. This information is not intended to replace advice given to you by your health care provider. Make sure you discuss any questions you have with your health care provider. Document Revised: 04/20/2021 Document Reviewed: 04/20/2021 Elsevier Patient Education  2023 Elsevier Inc.  

## 2021-12-15 NOTE — Progress Notes (Signed)
Isaiah Fowler 61 y.o.   Chief Complaint  Patient presents with   Follow-up    No concerns    HISTORY OF PRESENT ILLNESS: This is a 61 y.o. male with a history of hypertension and dyslipidemia here for follow-up. Also has history of arthritis and chronic pain to multiple joints but mostly right knee.  Has appointment at the [facility] tomorrow to see hopefully orthopedist. Needs referral for colonoscopy. No other complaints or medical concerns today Compliant with medication.  Normal blood pressure readings at home and in the office. BP Readings from Last 3 Encounters:  12/15/21 132/76  06/10/21 122/70  07/23/20 (!) 152/90     HPI   Prior to Admission medications   Medication Sig Start Date End Date Taking? Authorizing Provider  acetaminophen (TYLENOL) 325 MG tablet  03/01/19  Yes [provider]  atorvastatin (LIPITOR) 80 MG tablet  11/28/18  Yes [provider]  chlorhexidine (HIBICLENS) 4 % external liquid  08/31/18  Yes [provider]  cyclobenzaprine (FLEXERIL) 10 MG tablet Take 1 tablet (10 mg total) by mouth 3 (three) times daily as needed for muscle spasms. 07/23/20  Yes Just, Laurita Quint, FNP  diclofenac sodium (VOLTAREN) 1 % GEL Apply 2 g topically 4 (four) times daily. 03/29/19  Yes Aundra Dubin, PA-C  gabapentin (NEURONTIN) 300 MG capsule Take 1 capsule (300 mg total) by mouth 3 (three) times daily. 01/20/20  Yes Jacelyn Pi, Lilia Argue, MD  hydrOXYzine (ATARAX/VISTARIL) 25 MG tablet  11/28/18  Yes [provider]  ibuprofen (ADVIL) 800 MG tablet Take 1 tablet (800 mg total) by mouth every 6 (six) hours as needed. 03/30/20  Yes Felipa Furnace, DPM  lisinopril-hydrochlorothiazide (ZESTORETIC) 10-12.5 MG tablet Take 2 tablets by mouth daily. 07/23/20  Yes Just, Laurita Quint, FNP  meloxicam (MOBIC) 15 MG tablet Take 1 tablet (15 mg total) by mouth daily. 01/20/20  Yes Jacelyn Pi, Lilia Argue, MD  methocarbamol (ROBAXIN) 500 MG tablet Take 1 tablet (500 mg total) by  mouth 2 (two) times daily as needed. 07/29/20  Yes Aundra Dubin, PA-C  oxyCODONE-acetaminophen (PERCOCET) 10-325 MG tablet Take 1 tablet by mouth every 4 (four) hours as needed for pain. 03/30/20  Yes Felipa Furnace, DPM  oxyCODONE-acetaminophen (PERCOCET) 5-325 MG tablet Take 1-2 tablets by mouth every 6 (six) hours as needed for severe pain. 03/30/20  Yes Felipa Furnace, DPM  Tacrolimus 0.1 % CREA Apply 2 application topically daily. 02/20/19  Yes Maximiano Coss, NP  terbinafine (LAMISIL) 250 MG tablet Take 1 tablet (250 mg total) by mouth daily. 02/27/19  Yes Maximiano Coss, NP  traMADol (ULTRAM) 50 MG tablet Take 1 tablet (50 mg total) by mouth 3 (three) times daily as needed. 07/29/20  Yes Aundra Dubin, PA-C    Allergies  Allergen Reactions   Other     Patient Active Problem List   Diagnosis Date Noted   Dyslipidemia 12/15/2021   Pure hypercholesterolemia 03/25/2019   Chronic pain of right ankle 03/25/2019   OSA on CPAP 01/08/2016   Essential hypertension, benign 10/29/2012    Past Medical History:  Diagnosis Date   Allergy    Hypertension     Past Surgical History:  Procedure Laterality Date   DG GREAT TOE RIGHT FOOT Right    FOOT AND ANKLE N [address] ST / PLATES, SCREWS   KNEE ARTHROSCOPY WITH MENISCAL REPAIR Left 2011   during [place] deployment    Social History   Socioeconomic History  Marital status: Married    Spouse name: Ann Held   Number of children: 4   Years of education: 12th grade   Highest education level: Not on file  Occupational History   Occupation: Dealer  Tobacco Use   Smoking status: Never   Smokeless tobacco: Never  Substance and Sexual Activity   Alcohol use: Yes    Alcohol/week: 2.0 standard drinks of alcohol    Types: 2 Standard drinks or equivalent per week    Comment: socially; once a week   Drug use: No   Sexual activity: Not on file  Other Topics Concern   Not on file  Social History Narrative   Lives with his wife and their 4  sons.   Social Determinants of Health   Financial Resource Strain: Not on file  Food Insecurity: Not on file  Transportation Needs: Not on file  Physical Activity: Not on file  Stress: Not on file  Social Connections: Not on file  Intimate Partner Violence: Not on file    No family history on file.   Review of Systems  Constitutional: Negative.  Negative for chills and fever.  HENT: Negative.  Negative for congestion and sore throat.   Respiratory: Negative.  Negative for cough and shortness of breath.   Cardiovascular: Negative.  Negative for chest pain and palpitations.  Gastrointestinal:  Negative for abdominal pain, diarrhea, nausea and vomiting.  Genitourinary: Negative.   Musculoskeletal:  Positive for joint pain.  Skin: Negative.  Negative for rash.  Neurological:  Negative for dizziness and headaches.  All other systems reviewed and are negative.  Today's Vitals   12/15/21 0759  BP: 132/76  Pulse: (!) 58  Temp: 97.9 F (36.6 C)  TempSrc: Oral  SpO2: 95%  Weight: 178 lb 8 oz (81 kg)  Height: 5\' 6"  (1.676 m)   Body mass index is 28.81 kg/m.   Physical Exam Vitals reviewed.  Constitutional:      Appearance: Normal appearance.  HENT:     Head: Normocephalic.     Mouth/Throat:     Mouth: Mucous membranes are moist.     Pharynx: Oropharynx is clear.  Eyes:     Extraocular Movements: Extraocular movements intact.     Conjunctiva/sclera: Conjunctivae normal.     Pupils: Pupils are equal, round, and reactive to light.  Cardiovascular:     Rate and Rhythm: Normal rate and regular rhythm.     Pulses: Normal pulses.     Heart sounds: Normal heart sounds.  Pulmonary:     Effort: Pulmonary effort is normal.     Breath sounds: Normal breath sounds.  Musculoskeletal:     Cervical back: No tenderness.  Lymphadenopathy:     Cervical: No cervical adenopathy.  Skin:    General: Skin is warm and dry.     Capillary Refill: Capillary refill takes less than 2  seconds.  Neurological:     General: No focal deficit present.     Mental Status: He is alert and oriented to person, place, and time.  Psychiatric:        Mood and Affect: Mood normal.        Behavior: Behavior normal.     ASSESSMENT & PLAN: A total of 47 minutes was spent with the patient and counseling/coordination of care regarding preparing for this visit, review of most recent office visit notes, review of most recent blood work results that show abnormal lipid profile, review of multiple chronic medical problems under management, review of  all medications, cardiovascular risks associated with hypertension and dyslipidemia, need for colon cancer screening with colonoscopy, prognosis, education on nutrition, documentation, need for follow-up.  Problem List Items Addressed This Visit       Cardiovascular and Mediastinum   Essential hypertension, benign - Primary    Well-controlled hypertension.  Continue Zestoretic 10-12.5 mg daily. Cardiovascular risks associated with hypertension discussed. BP Readings from Last 3 Encounters:  12/15/21 132/76  06/10/21 122/70  07/23/20 (!) 152/90           Respiratory   OSA on CPAP    Stable.  Compliant with CPAP treatment.        Other   Dyslipidemia    Stable.  Diet and nutrition discussed.  Continue atorvastatin 80 mg daily. The 10-year ASCVD risk score (Arnett DK, et al., 2019) is: 14.7%*   Values used to calculate the score:     Age: 64 years     Sex: Male     Is Non-Hispanic African American: Yes     Diabetic: No     Tobacco smoker: No     Systolic Blood Pressure: 132 mmHg     Is BP treated: Yes     HDL Cholesterol: 48 mg/dL*     Total Cholesterol: 169 mg/dL*     * - Cholesterol units were assumed for this score calculation       Chronic pain of right knee    Has history of arthritis of multiple joints.  Sprained right knee 3 weeks ago. Chronic pain affecting quality of life. Follows up at [place].  Has appointment for  tomorrow.      Other Visit Diagnoses     Colon cancer screening       Relevant Orders   Ambulatory referral to Gastroenterology      Patient Instructions  Hypertension, Adult High blood pressure (hypertension) is when the force of blood pumping through the arteries is too strong. The arteries are the blood vessels that carry blood from the heart throughout the body. Hypertension forces the heart to work harder to pump blood and may cause arteries to become narrow or stiff. Untreated or uncontrolled hypertension can lead to a heart attack, heart failure, a stroke, kidney disease, and other problems. A blood pressure reading consists of a higher number over a lower number. Ideally, your blood pressure should be below 120/80. The first ("top") number is called the systolic pressure. It is a measure of the pressure in your arteries as your heart beats. The second ("bottom") number is called the diastolic pressure. It is a measure of the pressure in your arteries as the heart relaxes. What are the causes? The exact cause of this condition is not known. There are some conditions that result in high blood pressure. What increases the risk? Certain factors may make you more likely to develop high blood pressure. Some of these risk factors are under your control, including: Smoking. Not getting enough exercise or physical activity. Being overweight. Having too much fat, sugar, calories, or salt (sodium) in your diet. Drinking too much alcohol. Other risk factors include: Having a personal history of heart disease, diabetes, high cholesterol, or kidney disease. Stress. Having a family history of high blood pressure and high cholesterol. Having obstructive sleep apnea. Age. The risk increases with age. What are the signs or symptoms? High blood pressure may not cause symptoms. Very high blood pressure (hypertensive crisis) may cause: Headache. Fast or irregular heartbeats  (palpitations). Shortness of breath. Nosebleed. Nausea  and vomiting. Vision changes. Severe chest pain, dizziness, and seizures. How is this diagnosed? This condition is diagnosed by measuring your blood pressure while you are seated, with your arm resting on a flat surface, your legs uncrossed, and your feet flat on the floor. The cuff of the blood pressure monitor will be placed directly against the skin of your upper arm at the level of your heart. Blood pressure should be measured at least twice using the same arm. Certain conditions can cause a difference in blood pressure between your right and left arms. If you have a high blood pressure reading during one visit or you have normal blood pressure with other risk factors, you may be asked to: Return on a different day to have your blood pressure checked again. Monitor your blood pressure at home for 1 week or longer. If you are diagnosed with hypertension, you may have other blood or imaging tests to help your health care provider understand your overall risk for other conditions. How is this treated? This condition is treated by making healthy lifestyle changes, such as eating healthy foods, exercising more, and reducing your alcohol intake. You may be referred for counseling on a healthy diet and physical activity. Your health care provider may prescribe medicine if lifestyle changes are not enough to get your blood pressure under control and if: Your systolic blood pressure is above 130. Your diastolic blood pressure is above 80. Your personal target blood pressure may vary depending on your medical conditions, your age, and other factors. Follow these instructions at home: Eating and drinking  Eat a diet that is high in fiber and potassium, and low in sodium, added sugar, and fat. An example of this eating plan is called the DASH diet. DASH stands for Dietary Approaches to Stop Hypertension. To eat this way: Eat plenty of fresh fruits  and vegetables. Try to fill one half of your plate at each meal with fruits and vegetables. Eat whole grains, such as whole-wheat pasta, brown rice, or whole-grain bread. Fill about one fourth of your plate with whole grains. Eat or drink low-fat dairy products, such as skim milk or low-fat yogurt. Avoid fatty cuts of meat, processed or cured meats, and poultry with skin. Fill about one fourth of your plate with lean proteins, such as fish, chicken without skin, beans, eggs, or tofu. Avoid pre-made and processed foods. These tend to be higher in sodium, added sugar, and fat. Reduce your daily sodium intake. Many people with hypertension should eat less than 1,500 mg of sodium a day. Do not drink alcohol if: Your health care provider tells you not to drink. You are pregnant, may be pregnant, or are planning to become pregnant. If you drink alcohol: Limit how much you have to: 0-1 drink a day for women. 0-2 drinks a day for men. Know how much alcohol is in your drink. In the [place] one drink equals one 12 oz bottle of beer (355 mL), one 5 oz glass of wine (148 mL), or one 1 oz glass of hard liquor (44 mL). Lifestyle  Work with your health care provider to maintain a healthy body weight or to lose weight. Ask what an ideal weight is for you. Get at least 30 minutes of exercise that causes your heart to beat faster (aerobic exercise) most days of the week. Activities may include walking, swimming, or biking. Include exercise to strengthen your muscles (resistance exercise), such as Pilates or lifting weights, as part of your weekly  exercise routine. Try to do these types of exercises for 30 minutes at least 3 days a week. Do not use any products that contain nicotine or tobacco. These products include cigarettes, chewing tobacco, and vaping devices, such as e-cigarettes. If you need help quitting, ask your health care provider. Monitor your blood pressure at home as told by your health care  provider. Keep all follow-up visits. This is important. Medicines Take over-the-counter and prescription medicines only as told by your health care provider. Follow directions carefully. Blood pressure medicines must be taken as prescribed. Do not skip doses of blood pressure medicine. Doing this puts you at risk for problems and can make the medicine less effective. Ask your health care provider about side effects or reactions to medicines that you should watch for. Contact a health care provider if you: Think you are having a reaction to a medicine you are taking. Have headaches that keep coming back (recurring). Feel dizzy. Have swelling in your ankles. Have trouble with your vision. Get help right away if you: Develop a severe headache or confusion. Have unusual weakness or numbness. Feel faint. Have severe pain in your chest or abdomen. Vomit repeatedly. Have trouble breathing. These symptoms may be an emergency. Get help right away. Call 911. Do not wait to see if the symptoms will go away. Do not drive yourself to the hospital. Summary Hypertension is when the force of blood pumping through your arteries is too strong. If this condition is not controlled, it may put you at risk for serious complications. Your personal target blood pressure may vary depending on your medical conditions, your age, and other factors. For most people, a normal blood pressure is less than 120/80. Hypertension is treated with lifestyle changes, medicines, or a combination of both. Lifestyle changes include losing weight, eating a healthy, low-sodium diet, exercising more, and limiting alcohol. This information is not intended to replace advice given to you by your health care provider. Make sure you discuss any questions you have with your health care provider. Document Revised: 04/20/2021 Document Reviewed: 04/20/2021 Elsevier Patient Education  2023 Elsevier Inc.    Edwina Barth, MD [place]  Primary Care at [facility]

## 2021-12-15 NOTE — Assessment & Plan Note (Signed)
Has history of arthritis of multiple joints.  Sprained right knee 3 weeks ago. Chronic pain affecting quality of life. Follows up at [place].  Has appointment for tomorrow.

## 2021-12-15 NOTE — Assessment & Plan Note (Signed)
Well-controlled hypertension.  Continue Zestoretic 10-12.5 mg daily. Cardiovascular risks associated with hypertension discussed. BP Readings from Last 3 Encounters:  12/15/21 132/76  06/10/21 122/70  07/23/20 (!) 152/90

## 2021-12-15 NOTE — Assessment & Plan Note (Signed)
Stable.  Diet and nutrition discussed.  Continue atorvastatin 80 mg daily. The 10-year ASCVD risk score (Arnett DK, et al., 2019) is: 14.7%*   Values used to calculate the score:     Age: 61 years     Sex: Male     Is Non-Hispanic African American: Yes     Diabetic: No     Tobacco smoker: No     Systolic Blood Pressure: 132 mmHg     Is BP treated: Yes     HDL Cholesterol: 48 mg/dL*     Total Cholesterol: 169 mg/dL*     * - Cholesterol units were assumed for this score calculation

## 2021-12-15 NOTE — Assessment & Plan Note (Signed)
Stable.  Compliant with CPAP treatment. 

## 2022-06-16 ENCOUNTER — Ambulatory Visit (HOSPITAL_COMMUNITY)
Admission: RE | Admit: 2022-06-16 | Discharge: 2022-06-16 | Disposition: A | Discharge status: Home / Self Care | Payer: Worker's Compensation | Source: Ambulatory Visit | Attending: Vascular Surgery | Admitting: Vascular Surgery

## 2022-06-16 ENCOUNTER — Other Ambulatory Visit (HOSPITAL_COMMUNITY): Payer: Self-pay | Admitting: Orthopedic Surgery

## 2022-06-16 DIAGNOSIS — M79606 Pain in leg, unspecified: Secondary | ICD-10-CM

## 2022-06-16 DIAGNOSIS — M79604 Pain in right leg: Secondary | ICD-10-CM | POA: Diagnosis not present
